# Patient Record
Sex: Female | Born: 1935 | Race: White | Hispanic: No | Marital: Married | Smoking: Never smoker
Health system: Southern US, Community
[De-identification: ages and names within clinical notes are randomized; demographics above are authoritative.]

## PROBLEM LIST (undated history)

## (undated) DIAGNOSIS — F0390 Unspecified dementia without behavioral disturbance: Secondary | ICD-10-CM

## (undated) DIAGNOSIS — K219 Gastro-esophageal reflux disease without esophagitis: Secondary | ICD-10-CM

## (undated) HISTORY — PX: ABDOMINAL HYSTERECTOMY: SHX81

---

## 2011-05-18 ENCOUNTER — Emergency Department (HOSPITAL_COMMUNITY)
Admission: EM | Admit: 2011-05-18 | Discharge: 2011-05-18 | Disposition: A | Discharge status: Home / Self Care | Payer: Medicare Other | Attending: Emergency Medicine | Admitting: Emergency Medicine

## 2011-05-18 ENCOUNTER — Emergency Department (HOSPITAL_COMMUNITY): Payer: Medicare Other

## 2011-05-18 ENCOUNTER — Encounter (HOSPITAL_COMMUNITY): Payer: Self-pay | Admitting: *Deleted

## 2011-05-18 DIAGNOSIS — W1809XA Striking against other object with subsequent fall, initial encounter: Secondary | ICD-10-CM | POA: Insufficient documentation

## 2011-05-18 DIAGNOSIS — W19XXXA Unspecified fall, initial encounter: Secondary | ICD-10-CM

## 2011-05-18 DIAGNOSIS — S0990XA Unspecified injury of head, initial encounter: Secondary | ICD-10-CM | POA: Insufficient documentation

## 2011-05-18 DIAGNOSIS — E119 Type 2 diabetes mellitus without complications: Secondary | ICD-10-CM | POA: Insufficient documentation

## 2011-05-18 DIAGNOSIS — F0390 Unspecified dementia without behavioral disturbance: Secondary | ICD-10-CM | POA: Insufficient documentation

## 2011-05-18 DIAGNOSIS — K219 Gastro-esophageal reflux disease without esophagitis: Secondary | ICD-10-CM | POA: Insufficient documentation

## 2011-05-18 DIAGNOSIS — R51 Headache: Secondary | ICD-10-CM | POA: Insufficient documentation

## 2011-05-18 DIAGNOSIS — Y9210 Unspecified residential institution as the place of occurrence of the external cause: Secondary | ICD-10-CM | POA: Insufficient documentation

## 2011-05-18 HISTORY — DX: Unspecified dementia, unspecified severity, without behavioral disturbance, psychotic disturbance, mood disturbance, and anxiety: F03.90

## 2011-05-18 HISTORY — DX: Gastro-esophageal reflux disease without esophagitis: K21.9

## 2011-05-18 MED ORDER — SODIUM CHLORIDE 0.9 % IV SOLN
INTRAVENOUS | Status: DC
  Administered 2011-05-18: 11:00:00 via INTRAVENOUS

## 2011-05-18 MED ORDER — ONDANSETRON HCL 4 MG/2ML IJ SOLN
4.0000 mg | Freq: Once | INTRAMUSCULAR | Status: AC
  Administered 2011-05-18: 4 mg via INTRAVENOUS
  Filled 2011-05-18: qty 2

## 2011-05-18 NOTE — ED Notes (Signed)
Pt is unable to tell me her phone number or any information of how to get a hold of family. Pt thinks she is Felicia Payne.

## 2011-05-18 NOTE — ED Notes (Signed)
I was able to get a hold of staff at [facility] regarding patient. They have confirmed that Felicia Payne lives in [facility] with husband. 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 Pt resides at [address] Write [address] Boneta Lucks [facility].

## 2011-05-18 NOTE — ED Notes (Signed)
Pt helped back into clothes. [facility] called for transport.

## 2011-05-18 NOTE — ED Notes (Addendum)
Pt up to use restroom. Reports dizziness with ambulation.

## 2011-05-18 NOTE — ED Notes (Signed)
Paperwork given to [facility] 

## 2011-05-18 NOTE — ED Provider Notes (Signed)
History     CSN: [id number]  Arrival date & time 05/18/11  1008   First MD Initiated Contact with Patient 05/18/11 1020      Chief Complaint  Patient presents with  . Fall    (Consider location/radiation/quality/duration/timing/severity/associated sxs/prior treatment) Patient is a 78 y.o. female presenting with fall. The history is provided by the patient.  Fall The accident occurred 1 to 2 hours ago. Associated symptoms include headaches. Pertinent negatives include no numbness, no abdominal pain, no nausea and no vomiting.   [facility] and reportedly [facility] and fell and hit the back of her head on the bedside table. No loss of consciousness. States she has pain in the back of her head. No dizziness. No chest pain or abdominal pain. She states she's been doing well the last few days. No nausea vomiting diarrhea. No neck pain.  Past Medical History  Diagnosis Date  . Dementia   . GERD (gastroesophageal reflux disease)   . Diabetes mellitus     History reviewed. No pertinent past surgical history.  History reviewed. No pertinent family history.  History  Substance Use Topics  . Smoking status: Never Smoker   . Smokeless tobacco: Not on file  . Alcohol Use: No    OB History    Grav Para Term Preterm Abortions TAB SAB Ect Mult Living                  Review of Systems  Constitutional: Negative for activity change and appetite change.  HENT: Negative for neck stiffness.   Eyes: Negative for pain.  Respiratory: Negative for chest tightness and shortness of breath.   Cardiovascular: Negative for chest pain and leg swelling.  Gastrointestinal: Negative for nausea, vomiting, abdominal pain and diarrhea.  Genitourinary: Negative for flank pain.  Musculoskeletal: Negative for back pain.  Skin: Negative for rash.  Neurological: Positive for headaches. Negative for weakness and numbness.  Psychiatric/Behavioral: Negative for behavioral problems.     Allergies  Review of patient's allergies indicates not on file.  Home Medications  No current outpatient prescriptions on file.  BP 133/69  Pulse 97  Temp(Src) 97.9 F (36.6 C) (Oral)  Resp 14  SpO2 97%  Physical Exam  Nursing note and vitals reviewed. Constitutional: She is oriented to person, place, and time. She appears well-developed and well-nourished.  HENT:  Head: Normocephalic.       Apparent hematoma and tenderness to left occipital area.  Eyes: EOM are normal. Pupils are equal, round, and reactive to light.  Neck: Normal range of motion. Neck supple. No thyromegaly present.  Cardiovascular: Normal rate, regular rhythm and normal heart sounds.   No murmur heard. Pulmonary/Chest: Effort normal and breath sounds normal. No respiratory distress. She has no wheezes. She has no rales.  Abdominal: Soft. Bowel sounds are normal. She exhibits no distension. There is no tenderness. There is no rebound and no guarding.  Musculoskeletal: Normal range of motion.       Cervical range of motion intact and painless.  Lymphadenopathy:    She has no cervical adenopathy.  Neurological: She is alert and oriented to person, place, and time. No cranial nerve deficit.  Skin: Skin is warm and dry.  Psychiatric: She has a normal mood and affect. Her speech is normal.    ED Course  Procedures (including critical care time)  Labs Reviewed - No data to display Ct Head Wo Contrast  05/18/2011  *RADIOLOGY REPORT*  Clinical Data: Headache.  Status post fall.  CT HEAD WITHOUT CONTRAST  Technique:  Contiguous axial images were obtained from the base of the skull through the vertex without contrast.  Comparison: None  Findings:  There is diffuse patchy low density throughout the subcortical and periventricular white matter consistent with chronic small vessel ischemic change.  There is prominence of the sulci and ventricles consistent with brain atrophy.  There is no evidence for acute brain  infarct, hemorrhage or mass.  The paranasal sinuses are clear.  The mastoid air cells are clear. The skull is intact.  IMPRESSION:  1.  Small vessel ischemic disease and brain atrophy. 2.  No acute intracranial abnormalities.  Original Report Authenticated By: Rosealee Albee, M.D.     1. Fall   2. Minor head injury     Patient developed some nauseousness and dizziness with sitting up. It resolved with rest.  MDM  Patient fell and hit her head. Report mechanical fall. Head CT is negative. Patient was able ambulate and was at her baseline. She was discharged home        Juliet Rude. Rubin Payor, MD 05/18/11 1546

## 2011-05-18 NOTE — ED Notes (Signed)
Pt is from senior living states she lost balance and fell striking the back of her head on the bedside table. Pt with hematoma. Denies any loc. Pt on LSB and [facility].

## 2012-06-04 ENCOUNTER — Other Ambulatory Visit: Payer: Self-pay | Admitting: Geriatric Medicine

## 2012-06-04 ENCOUNTER — Ambulatory Visit
Admission: RE | Admit: 2012-06-04 | Discharge: 2012-06-04 | Disposition: A | Discharge status: Home / Self Care | Payer: No Typology Code available for payment source | Source: Ambulatory Visit | Attending: Geriatric Medicine | Admitting: Geriatric Medicine

## 2012-06-04 DIAGNOSIS — Z9181 History of falling: Secondary | ICD-10-CM

## 2012-06-04 DIAGNOSIS — R110 Nausea: Secondary | ICD-10-CM

## 2012-08-29 ENCOUNTER — Other Ambulatory Visit: Payer: Self-pay | Admitting: Geriatric Medicine

## 2012-08-29 ENCOUNTER — Ambulatory Visit
Admission: RE | Admit: 2012-08-29 | Discharge: 2012-08-29 | Disposition: A | Discharge status: Home / Self Care | Payer: Medicare Other | Source: Ambulatory Visit | Attending: Geriatric Medicine | Admitting: Geriatric Medicine

## 2012-08-29 ENCOUNTER — Other Ambulatory Visit: Payer: Medicare Other

## 2012-08-29 DIAGNOSIS — R52 Pain, unspecified: Secondary | ICD-10-CM

## 2012-08-29 DIAGNOSIS — W19XXXA Unspecified fall, initial encounter: Secondary | ICD-10-CM

## 2012-08-29 DIAGNOSIS — W19XXXD Unspecified fall, subsequent encounter: Secondary | ICD-10-CM

## 2012-09-05 ENCOUNTER — Other Ambulatory Visit: Payer: Self-pay | Admitting: Geriatric Medicine

## 2012-09-05 ENCOUNTER — Ambulatory Visit
Admission: RE | Admit: 2012-09-05 | Discharge: 2012-09-05 | Disposition: A | Discharge status: Home / Self Care | Payer: Medicare Other | Source: Ambulatory Visit | Attending: Geriatric Medicine | Admitting: Geriatric Medicine

## 2012-09-05 DIAGNOSIS — M545 Low back pain: Secondary | ICD-10-CM

## 2013-02-11 ENCOUNTER — Ambulatory Visit (INDEPENDENT_AMBULATORY_CARE_PROVIDER_SITE_OTHER): Payer: Medicare Other | Admitting: Podiatry

## 2013-02-11 ENCOUNTER — Encounter: Payer: Self-pay | Admitting: Podiatry

## 2013-02-11 VITALS — BP 125/69 | HR 105 | Resp 16 | Ht 61.0 in | Wt 160.0 lb

## 2013-02-11 DIAGNOSIS — Q828 Other specified congenital malformations of skin: Secondary | ICD-10-CM

## 2013-02-11 DIAGNOSIS — E119 Type 2 diabetes mellitus without complications: Secondary | ICD-10-CM

## 2013-02-11 NOTE — Progress Notes (Signed)
Felicia Payne presents today with a chief complaint of a painful callus to her left hallux and sub-first metatarsophalangeal joint left.  Objective: She is alert and oriented. Pulses remain palpable left foot. Reactive hyperkeratosis present hallux and sub-first met left. No other lesions.  Assessment: Plantarflexed first metatarsal resulting in a reactive hyperkeratosis sub-first met left. Reactive hyperkeratosis plantar medial aspect of the IP joint hallux left.  Plan: Debridement of all reactive hyperkeratosis today without iatrogenic lesions. All with her on an as-needed basis or for routine nail debridement.

## 2013-02-26 ENCOUNTER — Telehealth: Payer: Self-pay | Admitting: *Deleted

## 2013-02-26 NOTE — Telephone Encounter (Signed)
Pattricia Boss states unable to read Dr Geryl Rankins statement after debrided callouse.  I checked to progress notes and it stated pulses are still palpable in left foot.

## 2013-05-06 ENCOUNTER — Ambulatory Visit: Payer: Self-pay | Admitting: Podiatry

## 2013-05-15 ENCOUNTER — Ambulatory Visit: Payer: Self-pay | Admitting: Podiatry

## 2013-05-20 ENCOUNTER — Ambulatory Visit (INDEPENDENT_AMBULATORY_CARE_PROVIDER_SITE_OTHER): Payer: Medicare Other

## 2013-05-20 ENCOUNTER — Encounter: Payer: Self-pay | Admitting: Podiatry

## 2013-05-20 ENCOUNTER — Ambulatory Visit (INDEPENDENT_AMBULATORY_CARE_PROVIDER_SITE_OTHER): Payer: Medicare Other | Admitting: Podiatry

## 2013-05-20 VITALS — BP 122/72 | HR 90 | Resp 16 | Ht 61.0 in

## 2013-05-20 DIAGNOSIS — M19071 Primary osteoarthritis, right ankle and foot: Secondary | ICD-10-CM

## 2013-05-20 DIAGNOSIS — M79609 Pain in unspecified limb: Secondary | ICD-10-CM

## 2013-05-20 DIAGNOSIS — B351 Tinea unguium: Secondary | ICD-10-CM

## 2013-05-20 DIAGNOSIS — M79673 Pain in unspecified foot: Secondary | ICD-10-CM

## 2013-05-20 DIAGNOSIS — M19079 Primary osteoarthritis, unspecified ankle and foot: Secondary | ICD-10-CM

## 2013-05-20 NOTE — Progress Notes (Signed)
She presents today with a chief complaint of painful toenails one through 5 bilateral.  Objective: Vital signs are stable she is alert and oriented x3. Pulses are palpable bilateral. Nails are thick yellow dystrophic with mycotic.

## 2013-12-02 ENCOUNTER — Encounter: Payer: Self-pay | Admitting: Podiatry

## 2013-12-02 ENCOUNTER — Ambulatory Visit (INDEPENDENT_AMBULATORY_CARE_PROVIDER_SITE_OTHER): Payer: Medicare Other | Admitting: Podiatry

## 2013-12-02 DIAGNOSIS — M79676 Pain in unspecified toe(s): Secondary | ICD-10-CM

## 2013-12-02 DIAGNOSIS — L84 Corns and callosities: Secondary | ICD-10-CM

## 2013-12-02 DIAGNOSIS — B351 Tinea unguium: Secondary | ICD-10-CM

## 2013-12-02 DIAGNOSIS — E119 Type 2 diabetes mellitus without complications: Secondary | ICD-10-CM

## 2013-12-02 DIAGNOSIS — M79609 Pain in unspecified limb: Secondary | ICD-10-CM

## 2013-12-02 DIAGNOSIS — Q828 Other specified congenital malformations of skin: Secondary | ICD-10-CM

## 2013-12-02 NOTE — Progress Notes (Signed)
She presents today with a chief complaint of painful elongated toenails painful calluses plantar aspect of the bilateral foot.  Objective: Vital signs are stable she is alert and oriented x3 pulses are palpable bilateral. Nails are thick yellow dystrophic onychomycotic and painful palpation. Reactive hyperkeratosis and porokeratotic lesions plantar aspect of the bilateral foot are painful and noted to be noninfected.  Assessment: Pain in limb secondary to onychomycosis and porokeratosis bilateral.  Plan: Debridement all reactive hyperkeratosis and debridement nails 1 through 5 bilateral covered service secondary to pain. 

## 2014-03-10 ENCOUNTER — Encounter: Payer: Self-pay | Admitting: Podiatry

## 2014-03-10 ENCOUNTER — Ambulatory Visit (INDEPENDENT_AMBULATORY_CARE_PROVIDER_SITE_OTHER): Payer: Medicare Other | Admitting: Podiatry

## 2014-03-10 DIAGNOSIS — M79676 Pain in unspecified toe(s): Secondary | ICD-10-CM

## 2014-03-10 DIAGNOSIS — Q828 Other specified congenital malformations of skin: Secondary | ICD-10-CM

## 2014-03-10 DIAGNOSIS — B351 Tinea unguium: Secondary | ICD-10-CM

## 2014-03-10 DIAGNOSIS — E1151 Type 2 diabetes mellitus with diabetic peripheral angiopathy without gangrene: Secondary | ICD-10-CM

## 2014-03-10 NOTE — Progress Notes (Signed)
She presents today with a chief complaint of painful elongated toenails painful calluses plantar aspect of the bilateral foot.  Objective: Vital signs are stable she is alert and oriented x3 pulses are palpable bilateral. Nails are thick yellow dystrophic onychomycotic and painful palpation. Reactive hyperkeratosis and porokeratotic lesions plantar aspect of the bilateral foot are painful and noted to be noninfected.  Assessment: Pain in limb secondary to onychomycosis and porokeratosis bilateral.  Plan: Debridement all reactive hyperkeratosis and debridement nails 1 through 5 bilateral covered service secondary to pain.

## 2014-03-10 NOTE — Patient Instructions (Signed)
Diabetes and Foot Care Diabetes may cause you to have problems because of poor blood supply (circulation) to your feet and legs. This may cause the skin on your feet to become thinner, break easier, and heal more slowly. Your skin may become dry, and the skin may peel and crack. You may also have nerve damage in your legs and feet causing decreased feeling in them. You may not notice minor injuries to your feet that could lead to infections or more serious problems. Taking care of your feet is one of the most important things you can do for yourself.  HOME CARE INSTRUCTIONS  Wear shoes at all times, even in the house. Do not go barefoot. Bare feet are easily injured.  Check your feet daily for blisters, cuts, and redness. If you cannot see the bottom of your feet, use a mirror or ask someone for help.  Wash your feet with warm water (do not use hot water) and mild soap. Then pat your feet and the areas between your toes until they are completely dry. Do not soak your feet as this can dry your skin.  Apply a moisturizing lotion or petroleum jelly (that does not contain alcohol and is unscented) to the skin on your feet and to dry, brittle toenails. Do not apply lotion between your toes.  Trim your toenails straight across. Do not dig under them or around the cuticle. File the edges of your nails with an emery board or nail file.  Do not cut corns or calluses or try to remove them with medicine.  Wear clean socks or stockings every day. Make sure they are not too tight. Do not wear knee-high stockings since they may decrease blood flow to your legs.  Wear shoes that fit properly and have enough cushioning. To break in new shoes, wear them for just a few hours a day. This prevents you from injuring your feet. Always look in your shoes before you put them on to be sure there are no objects inside.  Do not cross your legs. This may decrease the blood flow to your feet.  If you find a minor scrape,  cut, or break in the skin on your feet, keep it and the skin around it clean and dry. These areas may be cleansed with mild soap and water. Do not cleanse the area with peroxide, alcohol, or iodine.  When you remove an adhesive bandage, be sure not to damage the skin around it.  If you have a wound, look at it several times a day to make sure it is healing.  Do not use heating pads or hot water bottles. They may burn your skin. If you have lost feeling in your feet or legs, you may not know it is happening until it is too late.  Make sure your health care provider performs a complete foot exam at least annually or more often if you have foot problems. Report any cuts, sores, or bruises to your health care provider immediately. SEEK MEDICAL CARE IF:   You have an injury that is not healing.  You have cuts or breaks in the skin.  You have an ingrown nail.  You notice redness on your legs or feet.  You feel burning or tingling in your legs or feet.  You have pain or cramps in your legs and feet.  Your legs or feet are numb.  Your feet always feel cold. SEEK IMMEDIATE MEDICAL CARE IF:   There is increasing redness,   swelling, or pain in or around a wound.  There is a red line that goes up your leg.  Pus is coming from a wound.  You develop a fever or as directed by your health care provider.  You notice a bad smell coming from an ulcer or wound. Document Released: 04/07/2000 Document Revised: 12/11/2012 Document Reviewed: 09/17/2012 Exit[employer] Patient Information 2015 [employer]. This information is not intended to replace advice given to you by your health care provider. Make sure you discuss any questions you have with your health care provider.  

## 2014-06-11 ENCOUNTER — Ambulatory Visit (INDEPENDENT_AMBULATORY_CARE_PROVIDER_SITE_OTHER): Payer: Medicare Other | Admitting: Podiatry

## 2014-06-11 DIAGNOSIS — B351 Tinea unguium: Secondary | ICD-10-CM

## 2014-06-11 DIAGNOSIS — Q828 Other specified congenital malformations of skin: Secondary | ICD-10-CM

## 2014-06-11 DIAGNOSIS — M79676 Pain in unspecified toe(s): Secondary | ICD-10-CM

## 2014-06-11 NOTE — Progress Notes (Signed)
She presents today with a chief complaint of painful elongated toenails painful calluses plantar aspect of the bilateral foot.  Objective: Vital signs are stable she is alert and oriented x3 pulses are palpable bilateral. Nails are thick yellow dystrophic onychomycotic and painful palpation. Reactive hyperkeratosis and porokeratotic lesions plantar aspect of the bilateral foot are painful and noted to be noninfected.  Assessment: Pain in limb secondary to onychomycosis and porokeratosis bilateral.  Plan: Debridement all reactive hyperkeratosis and debridement nails 1 through 5 bilateral covered service secondary to pain. 

## 2014-09-17 ENCOUNTER — Ambulatory Visit: Payer: Medicare Other

## 2014-10-13 ENCOUNTER — Ambulatory Visit (INDEPENDENT_AMBULATORY_CARE_PROVIDER_SITE_OTHER): Payer: Medicare Other | Admitting: Podiatry

## 2014-10-13 ENCOUNTER — Encounter: Payer: Self-pay | Admitting: Podiatry

## 2014-10-13 DIAGNOSIS — B351 Tinea unguium: Secondary | ICD-10-CM

## 2014-10-13 DIAGNOSIS — M79676 Pain in unspecified toe(s): Secondary | ICD-10-CM | POA: Diagnosis not present

## 2014-10-13 DIAGNOSIS — E119 Type 2 diabetes mellitus without complications: Secondary | ICD-10-CM

## 2014-10-13 NOTE — Progress Notes (Signed)
Patient ID: Felicia Payne, female   DOB: [birth year], 81 y.o.   MRN: [id number] Complaint:  Visit Type: Patient returns to my office for continued preventative foot care services. Complaint: Patient states" my nails have grown long and thick and become painful to walk and wear shoes" Patient has been diagnosed with DM with no foot complications.She presents for preventative foot care services. No changes to ROS  Podiatric Exam: Vascular: dorsalis pedis and posterior tibial pulses are palpable bilateral. Capillary return is immediate. Temperature gradient is WNL. Skin turgor WNL  Sensorium: Normal Semmes Weinstein monofilament test. Normal tactile sensation bilaterally. Nail Exam: Pt has thick disfigured discolored nails with subungual debris noted bilateral entire nail hallux through fifth toenails Ulcer Exam: There is no evidence of ulcer or pre-ulcerative changes or infection. Orthopedic Exam: Muscle tone and strength are WNL. No limitations in general ROM. No crepitus or effusions noted. Foot type and digits show no abnormalities. Bony prominences are unremarkable. Skin: No Porokeratosis. No infection or ulcers.  Diffuse asymptomatic callus   Diagnosis:  Tinea unguium, Pain in right toe, pain in left toes  Treatment & Plan Procedures and Treatment: Consent by patient was obtained for treatment procedures. The patient understood the discussion of treatment and procedures well. All questions were answered thoroughly reviewed. Debridement of mycotic and hypertrophic toenails, 1 through 5 bilateral and clearing of subungual debris. No ulceration, no infection noted.  Return Visit-Office Procedure: Patient instructed to return to the office for a follow up visit 3 months for continued evaluation and treatment.

## 2015-01-19 ENCOUNTER — Encounter: Payer: Self-pay | Admitting: Podiatry

## 2015-01-19 ENCOUNTER — Ambulatory Visit (INDEPENDENT_AMBULATORY_CARE_PROVIDER_SITE_OTHER): Payer: Medicare Other | Admitting: Podiatry

## 2015-01-19 DIAGNOSIS — M79676 Pain in unspecified toe(s): Secondary | ICD-10-CM | POA: Diagnosis not present

## 2015-01-19 DIAGNOSIS — B351 Tinea unguium: Secondary | ICD-10-CM

## 2015-01-19 DIAGNOSIS — E119 Type 2 diabetes mellitus without complications: Secondary | ICD-10-CM | POA: Diagnosis not present

## 2015-01-19 NOTE — Progress Notes (Signed)
Patient ID: Felicia Payne, female   DOB: 08/28/1930, 81 y.o.   MRN: [id number] Complaint:  Visit Type: Patient returns to my office for continued preventative foot care services. Complaint: Patient states" my nails have grown long and thick and become painful to walk and wear shoes" Patient has been diagnosed with DM with no foot complications.She presents for preventative foot care services. No changes to ROS  Podiatric Exam: Vascular: dorsalis pedis and posterior tibial pulses are palpable bilateral. Capillary return is immediate. Temperature gradient is WNL. Skin turgor WNL  Sensorium: Normal Semmes Weinstein monofilament test. Normal tactile sensation bilaterally. Nail Exam: Pt has thick disfigured discolored nails with subungual debris noted bilateral entire nail hallux through fifth toenails Ulcer Exam: There is no evidence of ulcer or pre-ulcerative changes or infection. Orthopedic Exam: Muscle tone and strength are WNL. No limitations in general ROM. No crepitus or effusions noted. Foot type and digits show no abnormalities. Bony prominences are unremarkable. Skin: No Porokeratosis. No infection or ulcers.  Diffuse asymptomatic callus   Diagnosis:  Tinea unguium, Pain in right toe, pain in left toes  Treatment & Plan Procedures and Treatment: Consent by patient was obtained for treatment procedures. The patient understood the discussion of treatment and procedures well. All questions were answered thoroughly reviewed. Debridement of mycotic and hypertrophic toenails, 1 through 5 bilateral and clearing of subungual debris. No ulceration, no infection noted.  Return Visit-Office Procedure: Patient instructed to return to the office for a follow up visit 3 months for continued evaluation and treatment. 

## 2015-04-27 ENCOUNTER — Encounter: Payer: Self-pay | Admitting: Podiatry

## 2015-04-27 ENCOUNTER — Ambulatory Visit (INDEPENDENT_AMBULATORY_CARE_PROVIDER_SITE_OTHER): Payer: Medicare Other | Admitting: Podiatry

## 2015-04-27 DIAGNOSIS — B351 Tinea unguium: Secondary | ICD-10-CM | POA: Diagnosis not present

## 2015-04-27 DIAGNOSIS — M79676 Pain in unspecified toe(s): Secondary | ICD-10-CM | POA: Diagnosis not present

## 2015-04-27 NOTE — Progress Notes (Signed)
Patient ID: Felicia Payne, female   DOB: 02/04/1931, 82 y.o.   MRN: [id number] Complaint:  Visit Type: Patient returns to my office for continued preventative foot care services. Complaint: Patient states" my nails have grown long and thick and become painful to walk and wear shoes" Patient has been diagnosed with DM with no foot complications.She presents for preventative foot care services. No changes to ROS  Podiatric Exam: Vascular: dorsalis pedis and posterior tibial pulses are palpable bilateral. Capillary return is immediate. Temperature gradient is WNL. Skin turgor WNL  Sensorium: Normal Semmes Weinstein monofilament test. Normal tactile sensation bilaterally. Nail Exam: Pt has thick disfigured discolored nails with subungual debris noted bilateral entire nail hallux through fifth toenails Ulcer Exam: There is no evidence of ulcer or pre-ulcerative changes or infection. Orthopedic Exam: Muscle tone and strength are WNL. No limitations in general ROM. No crepitus or effusions noted. Foot type and digits show no abnormalities. Bony prominences are unremarkable. Skin: No Porokeratosis. No infection or ulcers.  Diffuse asymptomatic callus   Diagnosis:  Tinea unguium, Pain in right toe, pain in left toes  Treatment & Plan Procedures and Treatment: Consent by patient was obtained for treatment procedures. The patient understood the discussion of treatment and procedures well. All questions were answered thoroughly reviewed. Debridement of mycotic and hypertrophic toenails, 1 through 5 bilateral and clearing of subungual debris. No ulceration, no infection noted.  Return Visit-Office Procedure: Patient instructed to return to the office for a follow up visit 3 months for continued evaluation and treatment.   Marquelle Musgrave DPM 

## 2015-04-28 ENCOUNTER — Telehealth: Payer: Self-pay | Admitting: *Deleted

## 2015-04-28 NOTE — Telephone Encounter (Addendum)
Misty StanleyLisa - 2nd shift nurse supervisor states they are unable to read Dr. Aram BeechamMayer's note on the returning paperwork for this pt.  Left message informing pt Dr. Stacie AcresMayer stated the appt was for a debridement of toenails with no abnormal findings, and to return in 3 months or as needed.  Mardelle Mattendy states [facility] did not get their return orders sheet, but still need notes stating pt was seen at [facility] on 04/27/2015.  Faxed clinicals of 04/27/2015.

## 2015-06-04 ENCOUNTER — Emergency Department (HOSPITAL_COMMUNITY): Payer: Medicare Other

## 2015-06-04 ENCOUNTER — Emergency Department (HOSPITAL_COMMUNITY)
Admission: EM | Admit: 2015-06-04 | Discharge: 2015-06-04 | Disposition: A | Discharge status: Home / Self Care | Payer: Medicare Other | Attending: Emergency Medicine | Admitting: Emergency Medicine

## 2015-06-04 ENCOUNTER — Encounter (HOSPITAL_COMMUNITY): Payer: Self-pay | Admitting: Emergency Medicine

## 2015-06-04 DIAGNOSIS — Z79899 Other long term (current) drug therapy: Secondary | ICD-10-CM | POA: Insufficient documentation

## 2015-06-04 DIAGNOSIS — Z7982 Long term (current) use of aspirin: Secondary | ICD-10-CM | POA: Diagnosis not present

## 2015-06-04 DIAGNOSIS — W050XXA Fall from non-moving wheelchair, initial encounter: Secondary | ICD-10-CM | POA: Insufficient documentation

## 2015-06-04 DIAGNOSIS — E119 Type 2 diabetes mellitus without complications: Secondary | ICD-10-CM | POA: Diagnosis not present

## 2015-06-04 DIAGNOSIS — S8992XA Unspecified injury of left lower leg, initial encounter: Secondary | ICD-10-CM | POA: Insufficient documentation

## 2015-06-04 DIAGNOSIS — Z791 Long term (current) use of non-steroidal anti-inflammatories (NSAID): Secondary | ICD-10-CM | POA: Diagnosis not present

## 2015-06-04 DIAGNOSIS — K219 Gastro-esophageal reflux disease without esophagitis: Secondary | ICD-10-CM | POA: Diagnosis not present

## 2015-06-04 DIAGNOSIS — S3992XA Unspecified injury of lower back, initial encounter: Secondary | ICD-10-CM | POA: Insufficient documentation

## 2015-06-04 DIAGNOSIS — Y9389 Activity, other specified: Secondary | ICD-10-CM | POA: Diagnosis not present

## 2015-06-04 DIAGNOSIS — Y9289 Other specified places as the place of occurrence of the external cause: Secondary | ICD-10-CM | POA: Diagnosis not present

## 2015-06-04 DIAGNOSIS — F0390 Unspecified dementia without behavioral disturbance: Secondary | ICD-10-CM | POA: Diagnosis not present

## 2015-06-04 DIAGNOSIS — S3991XA Unspecified injury of abdomen, initial encounter: Secondary | ICD-10-CM | POA: Diagnosis not present

## 2015-06-04 DIAGNOSIS — Y998 Other external cause status: Secondary | ICD-10-CM | POA: Insufficient documentation

## 2015-06-04 DIAGNOSIS — N390 Urinary tract infection, site not specified: Secondary | ICD-10-CM | POA: Diagnosis not present

## 2015-06-04 DIAGNOSIS — G8929 Other chronic pain: Secondary | ICD-10-CM | POA: Diagnosis not present

## 2015-06-04 DIAGNOSIS — M545 Low back pain, unspecified: Secondary | ICD-10-CM

## 2015-06-04 DIAGNOSIS — S8991XA Unspecified injury of right lower leg, initial encounter: Secondary | ICD-10-CM | POA: Diagnosis not present

## 2015-06-04 DIAGNOSIS — Z7984 Long term (current) use of oral hypoglycemic drugs: Secondary | ICD-10-CM | POA: Insufficient documentation

## 2015-06-04 LAB — CBC WITH DIFFERENTIAL/PLATELET
BASOS PCT: 0 %
Basophils Absolute: 0 10*3/uL (ref 0.0–0.1)
EOS ABS: 0.2 10*3/uL (ref 0.0–0.7)
EOS PCT: 3 %
HCT: 39.7 % (ref 36.0–46.0)
Hemoglobin: 12.2 g/dL (ref 12.0–15.0)
LYMPHS ABS: 2.4 10*3/uL (ref 0.7–4.0)
Lymphocytes Relative: 29 %
MCH: 29.8 pg (ref 26.0–34.0)
MCHC: 30.7 g/dL (ref 30.0–36.0)
MCV: 96.8 fL (ref 78.0–100.0)
MONO ABS: 0.4 10*3/uL (ref 0.1–1.0)
MONOS PCT: 5 %
NEUTROS PCT: 63 %
Neutro Abs: 5.3 10*3/uL (ref 1.7–7.7)
PLATELETS: 282 10*3/uL (ref 150–400)
RBC: 4.1 MIL/uL (ref 3.87–5.11)
RDW: 13.7 % (ref 11.5–15.5)
WBC: 8.3 10*3/uL (ref 4.0–10.5)

## 2015-06-04 LAB — COMPREHENSIVE METABOLIC PANEL
ALK PHOS: 70 U/L (ref 38–126)
ALT: 14 U/L (ref 14–54)
AST: 16 U/L (ref 15–41)
Albumin: 3.5 g/dL (ref 3.5–5.0)
Anion gap: 11 (ref 5–15)
BUN: 15 mg/dL (ref 6–20)
CALCIUM: 9.5 mg/dL (ref 8.9–10.3)
CO2: 28 mmol/L (ref 22–32)
CREATININE: 0.74 mg/dL (ref 0.44–1.00)
Chloride: 103 mmol/L (ref 101–111)
GFR calc non Af Amer: >60 mL/min (ref >60)
GLUCOSE: 123 mg/dL — AB (ref 65–99)
Potassium: 4.5 mmol/L (ref 3.5–5.1)
SODIUM: 142 mmol/L (ref 135–145)
Total Bilirubin: 0.4 mg/dL (ref 0.3–1.2)
Total Protein: 7 g/dL (ref 6.5–8.1)

## 2015-06-04 LAB — URINE MICROSCOPIC-ADD ON

## 2015-06-04 LAB — URINALYSIS, ROUTINE W REFLEX MICROSCOPIC
Bilirubin Urine: NEGATIVE
Glucose, UA: NEGATIVE mg/dL
Ketones, ur: NEGATIVE mg/dL
Nitrite: POSITIVE — AB
Protein, ur: NEGATIVE mg/dL
SPECIFIC GRAVITY, URINE: 1.021 (ref 1.005–1.030)
pH: 5 (ref 5.0–8.0)

## 2015-06-04 MED ORDER — CEPHALEXIN 500 MG PO CAPS: 500.0000 mg | ORAL_CAPSULE | Freq: Two times a day (BID) | ORAL | Status: DC

## 2015-06-04 NOTE — ED Notes (Signed)
Patient attempted to urinate, was unsuccessful at this time. RN notified, will attempt again

## 2015-06-04 NOTE — Discharge Instructions (Signed)
You have a urinary tract infection and cultures were sent.  Your back xrays were very limited. If you have continued pain in your back you will need to follow up for more imaging.     Back Pain, Adult Back pain is very common in adults.The cause of back pain is rarely dangerous and the pain often gets better over time.The cause of your back pain may not be known. Some common causes of back pain include:  Strain of the muscles or ligaments supporting the spine.  Wear and tear (degeneration) of the spinal disks.  Arthritis.  Direct injury to the back. For many people, back pain may return. Since back pain is rarely dangerous, most people can learn to manage this condition on their own. HOME CARE INSTRUCTIONS Watch your back pain for any changes. The following actions may help to lessen any discomfort you are feeling:  Remain active. It is stressful on your back to sit or stand in one place for long periods of time. Do not sit, drive, or stand in one place for more than 30 minutes at a time. Take short walks on even surfaces as soon as you are able.Try to increase the length of time you walk each day.  Exercise regularly as directed by your health care provider. Exercise helps your back heal faster. It also helps avoid future injury by keeping your muscles strong and flexible.  Do not stay in bed.Resting more than 1-2 days can delay your recovery.  Pay attention to your body when you bend and lift. The most comfortable positions are those that put less stress on your recovering back. Always use proper lifting techniques, including:  Bending your knees.  Keeping the load close to your body.  Avoiding twisting.  Find a comfortable position to sleep. Use a firm mattress and lie on your side with your knees slightly bent. If you lie on your back, put a pillow under your knees.  Avoid feeling anxious or stressed.Stress increases muscle tension and can worsen back pain.It is important  to recognize when you are anxious or stressed and learn ways to manage it, such as with exercise.  Take medicines only as directed by your health care provider. Over-the-counter medicines to reduce pain and inflammation are often the most helpful.Your health care provider may prescribe muscle relaxant drugs.These medicines help dull your pain so you can more quickly return to your normal activities and healthy exercise.  Apply ice to the injured area:  Put ice in a plastic bag.  Place a towel between your skin and the bag.  Leave the ice on for 20 minutes, 2-3 times a day for the first 2-3 days. After that, ice and heat may be alternated to reduce pain and spasms.  Maintain a healthy weight. Excess weight puts extra stress on your back and makes it difficult to maintain good posture. SEEK MEDICAL CARE IF:  You have pain that is not relieved with rest or medicine.  You have increasing pain going down into the legs or buttocks.  You have pain that does not improve in one week.  You have night pain.  You lose weight.  You have a fever or chills. SEEK IMMEDIATE MEDICAL CARE IF:   You develop new bowel or bladder control problems.  You have unusual weakness or numbness in your arms or legs.  You develop nausea or vomiting.  You develop abdominal pain.  You feel faint.   This information is not intended to replace advice  given to you by your health care provider. Make sure you discuss any questions you have with your health care provider.   Document Released: 04/10/2005 Document Revised: 05/01/2014 Document Reviewed: 08/12/2013 Elsevier Interactive Patient Education 2016 [employer]  Urinary Tract Infection Urinary tract infections (UTIs) can develop anywhere along your urinary tract. Your urinary tract is your body's drainage system for removing wastes and extra water. Your urinary tract includes two kidneys, two ureters, a bladder, and a urethra. Your kidneys are a pair  of bean-shaped organs. Each kidney is about the size of your fist. They are located below your ribs, one on each side of your spine. CAUSES Infections are caused by microbes, which are microscopic organisms, including fungi, viruses, and bacteria. These organisms are so small that they can only be seen through a microscope. Bacteria are the microbes that most commonly cause UTIs. SYMPTOMS  Symptoms of UTIs may vary by age and gender of the patient and by the location of the infection. Symptoms in young women typically include a frequent and intense urge to urinate and a painful, burning feeling in the bladder or urethra during urination. Older women and men are more likely to be tired, shaky, and weak and have muscle aches and abdominal pain. A fever may mean the infection is in your kidneys. Other symptoms of a kidney infection include pain in your back or sides below the ribs, nausea, and vomiting. DIAGNOSIS To diagnose a UTI, your caregiver will ask you about your symptoms. Your caregiver will also ask you to provide a urine sample. The urine sample will be tested for bacteria and white blood cells. White blood cells are made by your body to help fight infection. TREATMENT  Typically, UTIs can be treated with medication. Because most UTIs are caused by a bacterial infection, they usually can be treated with the use of antibiotics. The choice of antibiotic and length of treatment depend on your symptoms and the type of bacteria causing your infection. HOME CARE INSTRUCTIONS  If you were prescribed antibiotics, take them exactly as your caregiver instructs you. Finish the medication even if you feel better after you have only taken some of the medication.  Drink enough water and fluids to keep your urine clear or pale yellow.  Avoid caffeine, tea, and carbonated beverages. They tend to irritate your bladder.  Empty your bladder often. Avoid holding urine for long periods of time.  Empty your  bladder before and after sexual intercourse.  After a bowel movement, women should cleanse from front to back. Use each tissue only once. SEEK MEDICAL CARE IF:   You have back pain.  You develop a fever.  Your symptoms do not begin to resolve within 3 days. SEEK IMMEDIATE MEDICAL CARE IF:   You have severe back pain or lower abdominal pain.  You develop chills.  You have nausea or vomiting.  You have continued burning or discomfort with urination. MAKE SURE YOU:   Understand these instructions.  Will watch your condition.  Will get help right away if you are not doing well or get worse.   This information is not intended to replace advice given to you by your health care provider. Make sure you discuss any questions you have with your health care provider.   Document Released: 01/18/2005 Document Revised: 12/30/2014 Document Reviewed: 05/19/2011 Elsevier Interactive Patient Education [employer]

## 2015-06-04 NOTE — ED Notes (Signed)
Bed: WA09 Expected date:  Expected time:  Means of arrival:  Comments: EMS FALL 
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
## 2015-06-04 NOTE — ED Notes (Signed)
Rees at bedside. 

## 2015-06-04 NOTE — ED Notes (Signed)
Bed: [facility]ALB Expected date:  Expected time:  Means of arrival:  Comments: 
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
## 2015-06-06 LAB — URINE CULTURE: Culture: >=100000

## 2015-06-07 ENCOUNTER — Telehealth (HOSPITAL_COMMUNITY): Payer: Self-pay

## 2015-06-07 NOTE — Telephone Encounter (Signed)
Post ED Visit - Positive Culture Follow-up  Culture report reviewed by antimicrobial stewardship pharmacist:   Enzo Bi, Pharm.D.  Celedonio Miyamoto, Pharm.D., BCPS  Garvin Fila, Pharm.D.  Georgina Pillion, Pharm.D., BCPS  [place], [place].D., BCPS, AAHIVP  Estella Husk, Pharm.D., BCPS, AAHIVP  Tennis Must, Pharm.D.  Sherle Poe, [address].D.  Positive urine culture, >/= 100,000 colonies -> Klebsiella Pneumoniae Treated with Cephalexin, organism sensitive to the same and no further patient follow-up is required at this time.  Arvid Right 06/07/2015, 8:39 PM

## 2015-07-27 ENCOUNTER — Ambulatory Visit: Payer: Medicare Other | Admitting: Podiatry

## 2015-08-31 ENCOUNTER — Encounter: Payer: Self-pay | Admitting: Podiatry

## 2015-08-31 ENCOUNTER — Ambulatory Visit (INDEPENDENT_AMBULATORY_CARE_PROVIDER_SITE_OTHER): Payer: Medicare Other | Admitting: Podiatry

## 2015-08-31 DIAGNOSIS — M79676 Pain in unspecified toe(s): Secondary | ICD-10-CM | POA: Diagnosis not present

## 2015-08-31 DIAGNOSIS — B351 Tinea unguium: Secondary | ICD-10-CM

## 2015-08-31 DIAGNOSIS — E119 Type 2 diabetes mellitus without complications: Secondary | ICD-10-CM

## 2015-08-31 NOTE — Addendum Note (Signed)
Addended by: Harlon FlorSOUTHERLAND, Kathlen Sakurai L on: 08/31/2015 04:32 PM   Modules accepted: Orders, Medications

## 2015-08-31 NOTE — Progress Notes (Signed)
Patient ID: Felicia Payne, female   DOB: 06/03/1930, 82 y.o.   MRN: [id number] Complaint:  Visit Type: Patient returns to my office for continued preventative foot care services. Complaint: Patient states" my nails have grown long and thick and become painful to walk and wear shoes" Patient has been diagnosed with DM with no foot complications.She presents for preventative foot care services. No changes to ROS  Podiatric Exam: Vascular: dorsalis pedis and posterior tibial pulses are palpable bilateral. Capillary return is immediate. Temperature gradient is WNL. Skin turgor WNL  Sensorium: Normal Semmes Weinstein monofilament test. Normal tactile sensation bilaterally. Nail Exam: Pt has thick disfigured discolored nails with subungual debris noted bilateral entire nail hallux through fifth toenails Ulcer Exam: There is no evidence of ulcer or pre-ulcerative changes or infection. Orthopedic Exam: Muscle tone and strength are WNL. No limitations in general ROM. No crepitus or effusions noted. Foot type and digits show no abnormalities. Bony prominences are unremarkable. Skin: No Porokeratosis. No infection or ulcers.  Diffuse asymptomatic callus   Diagnosis:  Tinea unguium, Pain in right toe, pain in left toes  Treatment & Plan Procedures and Treatment: Consent by patient was obtained for treatment procedures. The patient understood the discussion of treatment and procedures well. All questions were answered thoroughly reviewed. Debridement of mycotic and hypertrophic toenails, 1 through 5 bilateral and clearing of subungual debris. No ulceration, no infection noted.  Return Visit-Office Procedure: Patient instructed to return to the office for a follow up visit 3 months for continued evaluation and treatment.   Margree Gimbel DPM 

## 2015-09-09 ENCOUNTER — Emergency Department (HOSPITAL_COMMUNITY): Payer: Medicare Other

## 2015-09-09 ENCOUNTER — Encounter (HOSPITAL_COMMUNITY): Payer: Self-pay | Admitting: Emergency Medicine

## 2015-09-09 ENCOUNTER — Emergency Department (HOSPITAL_COMMUNITY)
Admission: EM | Admit: 2015-09-09 | Discharge: 2015-09-10 | Disposition: A | Discharge status: Home / Self Care | Payer: Medicare Other | Attending: Emergency Medicine | Admitting: Emergency Medicine

## 2015-09-09 DIAGNOSIS — Y999 Unspecified external cause status: Secondary | ICD-10-CM | POA: Diagnosis not present

## 2015-09-09 DIAGNOSIS — Z79899 Other long term (current) drug therapy: Secondary | ICD-10-CM | POA: Insufficient documentation

## 2015-09-09 DIAGNOSIS — Y92121 Bathroom in nursing home as the place of occurrence of the external cause: Secondary | ICD-10-CM | POA: Diagnosis not present

## 2015-09-09 DIAGNOSIS — Z7982 Long term (current) use of aspirin: Secondary | ICD-10-CM | POA: Insufficient documentation

## 2015-09-09 DIAGNOSIS — W19XXXA Unspecified fall, initial encounter: Secondary | ICD-10-CM | POA: Diagnosis not present

## 2015-09-09 DIAGNOSIS — E119 Type 2 diabetes mellitus without complications: Secondary | ICD-10-CM | POA: Insufficient documentation

## 2015-09-09 DIAGNOSIS — M542 Cervicalgia: Secondary | ICD-10-CM

## 2015-09-09 DIAGNOSIS — Y939 Activity, unspecified: Secondary | ICD-10-CM | POA: Diagnosis not present

## 2015-09-09 DIAGNOSIS — M25551 Pain in right hip: Secondary | ICD-10-CM | POA: Diagnosis not present

## 2015-09-09 DIAGNOSIS — F329 Major depressive disorder, single episode, unspecified: Secondary | ICD-10-CM | POA: Insufficient documentation

## 2015-09-09 DIAGNOSIS — M25559 Pain in unspecified hip: Secondary | ICD-10-CM

## 2015-09-09 DIAGNOSIS — G309 Alzheimer's disease, unspecified: Secondary | ICD-10-CM | POA: Insufficient documentation

## 2015-09-09 DIAGNOSIS — Z7984 Long term (current) use of oral hypoglycemic drugs: Secondary | ICD-10-CM | POA: Insufficient documentation

## 2015-09-09 DIAGNOSIS — M546 Pain in thoracic spine: Secondary | ICD-10-CM | POA: Diagnosis present

## 2015-09-09 MED ORDER — OXYCODONE-ACETAMINOPHEN 5-325 MG PO TABS
1.0000 | ORAL_TABLET | Freq: Once | ORAL | Status: AC
  Administered 2015-09-09: 1 via ORAL
  Filled 2015-09-09: qty 1
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
## 2015-09-09 NOTE — ED Notes (Signed)
Pt in X-Ray ?

## 2015-09-09 NOTE — ED Notes (Signed)
Pt transported to CT ?

## 2015-09-09 NOTE — Discharge Instructions (Signed)
Back Pain, Adult Patient may be given her routinely prescribed pain medications if needed for pain. She was given 1 Percocet in the emergency department. Back pain is very common in adults.The cause of back pain is rarely dangerous and the pain often gets better over time.The cause of your back pain may not be known. Some common causes of back pain include:  Strain of the muscles or ligaments supporting the spine.  Wear and tear (degeneration) of the spinal disks.  Arthritis.  Direct injury to the back. For many people, back pain may return. Since back pain is rarely dangerous, most people can learn to manage this condition on their own. HOME CARE INSTRUCTIONS Watch your back pain for any changes. The following actions may help to lessen any discomfort you are feeling:  Remain active. It is stressful on your back to sit or stand in one place for long periods of time. Do not sit, drive, or stand in one place for more than 30 minutes at a time. Take short walks on even surfaces as soon as you are able.Try to increase the length of time you walk each day.  Exercise regularly as directed by your health care provider. Exercise helps your back heal faster. It also helps avoid future injury by keeping your muscles strong and flexible.  Do not stay in bed.Resting more than 1-2 days can delay your recovery.  Pay attention to your body when you bend and lift. The most comfortable positions are those that put less stress on your recovering back. Always use proper lifting techniques, including:  Bending your knees.  Keeping the load close to your body.  Avoiding twisting.  Find a comfortable position to sleep. Use a firm mattress and lie on your side with your knees slightly bent. If you lie on your back, put a pillow under your knees.  Avoid feeling anxious or stressed.Stress increases muscle tension and can worsen back pain.It is important to recognize when you are anxious or stressed and  learn ways to manage it, such as with exercise.  Take medicines only as directed by your health care provider. Over-the-counter medicines to reduce pain and inflammation are often the most helpful.Your health care provider may prescribe muscle relaxant drugs.These medicines help dull your pain so you can more quickly return to your normal activities and healthy exercise.  Apply ice to the injured area:  Put ice in a plastic bag.  Place a towel between your skin and the bag.  Leave the ice on for 20 minutes, 2-3 times a day for the first 2-3 days. After that, ice and heat may be alternated to reduce pain and spasms.  Maintain a healthy weight. Excess weight puts extra stress on your back and makes it difficult to maintain good posture. SEEK MEDICAL CARE IF:  You have pain that is not relieved with rest or medicine.  You have increasing pain going down into the legs or buttocks.  You have pain that does not improve in one week.  You have night pain.  You lose weight.  You have a fever or chills. SEEK IMMEDIATE MEDICAL CARE IF:   You develop new bowel or bladder control problems.  You have unusual weakness or numbness in your arms or legs.  You develop nausea or vomiting.  You develop abdominal pain.  You feel faint.   This information is not intended to replace advice given to you by your health care provider. Make sure you discuss any questions you have  with your health care provider.   Document Released: 04/10/2005 Document Revised: 05/01/2014 Document Reviewed: 08/12/2013 Elsevier Interactive Patient Education 2016 Elsevier Inc[employer] Cervical Sprain A cervical sprain is an injury in the neck in which the strong, fibrous tissues (ligaments) that connect your neck bones stretch or tear. Cervical sprains can range from mild to severe. Severe cervical sprains can cause the neck vertebrae to be unstable. This can lead to damage of the spinal cord and can result in serious nervous  system problems. The amount of time it takes for a cervical sprain to get better depends on the cause and extent of the injury. Most cervical sprains heal in 1 to 3 weeks. CAUSES  Severe cervical sprains may be caused by:   Contact sport injuries (such as from football, rugby, wrestling, hockey, auto racing, gymnastics, diving, martial arts, or boxing).   Motor vehicle collisions.   Whiplash injuries. This is an injury from a sudden forward and backward whipping movement of the head and neck.  Falls.  Mild cervical sprains may be caused by:   Being in an awkward position, such as while cradling a telephone between your ear and shoulder.   Sitting in a chair that does not offer proper support.   Working at a poorly Marketing executive station.   Looking up or down for long periods of time.  SYMPTOMS   Pain, soreness, stiffness, or a burning sensation in the front, back, or sides of the neck. This discomfort may develop immediately after the injury or slowly, 24 hours or more after the injury.   Pain or tenderness directly in the middle of the back of the neck.   Shoulder or upper back pain.   Limited ability to move the neck.   Headache.   Dizziness.   Weakness, numbness, or tingling in the hands or arms.   Muscle spasms.   Difficulty swallowing or chewing.   Tenderness and swelling of the neck.  DIAGNOSIS  Most of the time your health care provider can diagnose a cervical sprain by taking your history and doing a physical exam. Your health care provider will ask about previous neck injuries and any known neck problems, such as arthritis in the neck. X-rays may be taken to find out if there are any other problems, such as with the bones of the neck. Other tests, such as a CT scan or MRI, may also be needed.  TREATMENT  Treatment depends on the severity of the cervical sprain. Mild sprains can be treated with rest, keeping the neck in place (immobilization),  and pain medicines. Severe cervical sprains are immediately immobilized. Further treatment is done to help with pain, muscle spasms, and other symptoms and may include:  Medicines, such as pain relievers, numbing medicines, or muscle relaxants.   Physical therapy. This may involve stretching exercises, strengthening exercises, and posture training. Exercises and improved posture can help stabilize the neck, strengthen muscles, and help stop symptoms from returning.  HOME CARE INSTRUCTIONS   Put ice on the injured area.   Put ice in a plastic bag.   Place a towel between your skin and the bag.   Leave the ice on for 15-20 minutes, 3-4 times a day.   If your injury was severe, you may have been given a cervical collar to wear. A cervical collar is a two-piece collar designed to keep your neck from moving while it heals.  Do not remove the collar unless instructed by your health care provider.  If  you have long hair, keep it outside of the collar.  Ask your health care provider before making any adjustments to your collar. Minor adjustments may be required over time to improve comfort and reduce pressure on your chin or on the back of your head.  Ifyou are allowed to remove the collar for cleaning or bathing, follow your health care provider's instructions on how to do so safely.  Keep your collar clean by wiping it with mild soap and water and drying it completely. If the collar you have been given includes removable pads, remove them every 1-2 days and hand wash them with soap and water. Allow them to air dry. They should be completely dry before you wear them in the collar.  If you are allowed to remove the collar for cleaning and bathing, wash and dry the skin of your neck. Check your skin for irritation or sores. If you see any, tell your health care provider.  Do not drive while wearing the collar.   Only take over-the-counter or prescription medicines for pain, discomfort,  or fever as directed by your health care provider.   Keep all follow-up appointments as directed by your health care provider.   Keep all physical therapy appointments as directed by your health care provider.   Make any needed adjustments to your workstation to promote good posture.   Avoid positions and activities that make your symptoms worse.   Warm up and stretch before being active to help prevent problems.  SEEK MEDICAL CARE IF:   Your pain is not controlled with medicine.   You are unable to decrease your pain medicine over time as planned.   Your activity level is not improving as expected.  SEEK IMMEDIATE MEDICAL CARE IF:   You develop any bleeding.  You develop stomach upset.  You have signs of an allergic reaction to your medicine.   Your symptoms get worse.   You develop new, unexplained symptoms.   You have numbness, tingling, weakness, or paralysis in any part of your body.  MAKE SURE YOU:   Understand these instructions.  Will watch your condition.  Will get help right away if you are not doing well or get worse.   This information is not intended to replace advice given to you by your health care provider. Make sure you discuss any questions you have with your health care provider.   Document Released: 02/05/2007 Document Revised: 04/15/2013 Document Reviewed: 10/16/2012 Elsevier Interactive Patient Education 2016 Elsevier Inc. Contusion A contusion is a deep bruise. Contusions are the result of a blunt injury to tissues and muscle fibers under the skin. The injury causes bleeding under the skin. The skin overlying the contusion may turn blue, purple, or yellow. Minor injuries will give you a painless contusion, but more severe contusions may stay painful and swollen for a few weeks.  CAUSES  This condition is usually caused by a blow, trauma, or direct force to an area of the body. SYMPTOMS  Symptoms of this condition  include:  Swelling of the injured area.  Pain and tenderness in the injured area.  Discoloration. The area may have redness and then turn blue, purple, or yellow. DIAGNOSIS  This condition is diagnosed based on a physical exam and medical history. An X-ray, CT scan, or MRI may be needed to determine if there are any associated injuries, such as broken bones (fractures). TREATMENT  Specific treatment for this condition depends on what area of the body was injured.  In general, the best treatment for a contusion is resting, icing, applying pressure to (compression), and elevating the injured area. This is often called the RICE strategy. Over-the-counter anti-inflammatory medicines may also be recommended for pain control.  HOME CARE INSTRUCTIONS   Rest the injured area.  If directed, apply ice to the injured area:  Put ice in a plastic bag.  Place a towel between your skin and the bag.  Leave the ice on for 20 minutes, 2-3 times per day.  If directed, apply light compression to the injured area using an elastic bandage. Make sure the bandage is not wrapped too tightly. Remove and reapply the bandage as directed by your health care provider.  If possible, raise (elevate) the injured area above the level of your heart while you are sitting or lying down.  Take over-the-counter and prescription medicines only as told by your health care provider. SEEK MEDICAL CARE IF:  Your symptoms do not improve after several days of treatment.  Your symptoms get worse.  You have difficulty moving the injured area. SEEK IMMEDIATE MEDICAL CARE IF:   You have severe pain.  You have numbness in a hand or foot.  Your hand or foot turns pale or cold.   This information is not intended to replace advice given to you by your health care provider. Make sure you discuss any questions you have with your health care provider.   Document Released: 01/18/2005 Document Revised: 12/30/2014 Document  Reviewed: 08/26/2014 Elsevier Interactive Patient Education [employer] Elsevier Inc[employer]

## 2015-09-09 NOTE — ED Notes (Signed)
MD at bedside. 

## 2015-09-09 NOTE — ED Notes (Signed)
Bed: [id number] Expected date:  Expected time:  Means of arrival:  Comments: EMS 82 [facility]/pain back of headand tailbone

## 2015-09-09 NOTE — ED Provider Notes (Signed)
CSN: [id number]     Arrival date & time 09/09/15  2018 History   First MD Initiated Contact with Patient 09/09/15 2032     Chief Complaint  Patient presents with  . Fall  . Back Pain     (Consider location/radiation/quality/duration/timing/severity/associated sxs/prior Treatment) HPI Patient had a fall at the nursing home. She lost her balance and fell between the toilet and the wall. There was no witnessed loss of consciousness. Patient thinks she may have hit her head. She complained of pain in her thoracic back and her cervical spine. She denies numbness or tingling. He does have Alzheimer's dementia but is alert and interactive. Family members are here to assist in history. He is at baseline mental status and function. Past Medical History  Diagnosis Date  . Dementia   . GERD (gastroesophageal reflux disease)   . Diabetes mellitus    Past Surgical History  Procedure Laterality Date  . Abdominal hysterectomy     No family history on file. Social History  Substance Use Topics  . Smoking status: Never Smoker   . Smokeless tobacco: None  . Alcohol Use: No   OB History    No data available     Review of Systems  10 Systems reviewed and are negative for acute change except as noted in the HPI.   Allergies  Codeine and Sulfa antibiotics  Home Medications   Prior to Admission medications   Medication Sig Start Date End Date Taking? Authorizing Provider  aspirin EC 81 MG tablet Take 81 mg by mouth daily.   Yes Historical Provider, MD  diclofenac sodium (VOLTAREN) 1 % GEL Apply 4 g topically 3 (three) times daily.   Yes Historical Provider, MD  donepezil (ARICEPT) 10 MG tablet Take 10 mg by mouth daily.    Yes Historical Provider, MD  famotidine (PEPCID) 20 MG tablet Take 20 mg by mouth daily.   Yes Historical Provider, MD  JANUVIA 100 MG tablet Take 100 mg by mouth daily.  11/12/13  Yes Historical Provider, MD  LEVEMIR 100 UNIT/ML injection Inject 16 Units into the skin  at bedtime.  09/18/13  Yes Historical Provider, MD  LYRICA 75 MG capsule Take 75 mg by mouth 2 (two) times daily.  11/10/13  Yes Historical Provider, MD  metFORMIN (GLUCOPHAGE-XR) 500 MG 24 hr tablet Take 500 mg by mouth daily with breakfast.  11/25/13  Yes Historical Provider, MD  NAMENDA XR 28 MG CP24 Take 28 mg by mouth daily.  11/12/13  Yes Historical Provider, MD  oxyCODONE-acetaminophen (PERCOCET/ROXICET) 5-325 MG per tablet Take 1 tablet by mouth 2 (two) times daily.    Yes Historical Provider, MD  oxyCODONE-acetaminophen (PERCOCET/ROXICET) 5-325 MG tablet Take 1 tablet by mouth every 6 (six) hours as needed for severe pain.   Yes Historical Provider, MD  pravastatin (PRAVACHOL) 10 MG tablet Take 10 mg by mouth daily.  11/12/13  Yes Historical Provider, MD  sertraline (ZOLOFT) 25 MG tablet Take 25 mg by mouth daily.   Yes Historical Provider, MD  vitamin B-12 (CYANOCOBALAMIN) 1000 MCG tablet Take 1,000 mcg by mouth daily.   Yes Historical Provider, MD  cephALEXin (KEFLEX) 500 MG capsule Take 1 capsule (500 mg total) by mouth 2 (two) times daily. Patient not taking: Reported on 09/09/2015 06/04/15   Tilden Fossa, MD  Vitamin D, Ergocalciferol, (DRISDOL) 50000 UNITS CAPS capsule Take 50,000 Units by mouth every 7 (seven) days.  11/12/13   Historical Provider, MD   BP 125/63 mmHg  Pulse 81  Temp(Src) 98.7 F (37.1 C) (Oral)  Resp 18  SpO2 97% Physical Exam  Constitutional: She is oriented to person, place, and time. She appears well-developed and well-nourished.  HENT:  Head: Normocephalic and atraumatic.  Eyes: EOM are normal. Pupils are equal, round, and reactive to light.  Neck: Neck supple.  Patient endorses point tenderness to palpation of the cervical spine from approximately C4-C7.  Cardiovascular: Normal rate, regular rhythm, normal heart sounds and intact distal pulses.   Pulmonary/Chest: Effort normal and breath sounds normal.  Abdominal: Soft. Bowel sounds are normal. She exhibits  no distension. There is no tenderness.  Musculoskeletal: Normal range of motion. She exhibits no edema.  Patient versus tenderness in the thoracic spine from approximately T5 to T 10. Range of motion of extremities is intact. No extremity deformities.  Neurological: She is oriented to person, place, and time. She has normal strength. She exhibits normal muscle tone. Coordination normal. GCS eye subscore is 4. GCS verbal subscore is 5. GCS motor subscore is 6.  Skin: Skin is warm, dry and intact.  Psychiatric: She has a normal mood and affect.    ED Course  Procedures (including critical care time) Labs Review Labs Reviewed - No data to display  Imaging Review Dg Thoracic Spine 2 View  09/09/2015  CLINICAL DATA:  Larey Seat this evening walking to the bathroom, upper back pain between shoulders radiating down back EXAM: THORACIC SPINE 2 VIEWS COMPARISON:  Chest radiographs 06/04/2015, cervical spine radiographs 08/29/2012 FINDINGS: Twelve pairs of ribs. Diffuse osseous demineralization. Broad based dextro convex thoracic scoliosis. Multilevel disc space narrowing and endplate spur formation. Vertebral body heights appear grossly maintained. No definite fracture, subluxation or bone destruction. Irregular inferior endplate T3 unchanged since 08/29/2012. Atherosclerotic calcification aorta. IMPRESSION: Degenerative disc disease changes thoracic spine with broad-based dextro convex scoliosis. No definite acute thoracic spine abnormalities. Electronically Signed   By: Ulyses Southward M.D.   On: 09/09/2015 22:08   Ct Head Wo Contrast  09/09/2015  CLINICAL DATA:  Fall. EXAM: CT HEAD WITHOUT CONTRAST CT CERVICAL SPINE WITHOUT CONTRAST TECHNIQUE: Multidetector CT imaging of the head and cervical spine was performed following the standard protocol without intravenous contrast. Multiplanar CT image reconstructions of the cervical spine were also generated. COMPARISON:  06/04/2015 FINDINGS: CT HEAD FINDINGS Mild  cerebral atrophy. No ventricular dilatation. Low-attenuation changes in the deep white matter consistent with small vessel ischemia. No mass effect or midline shift. No abnormal extra-axial fluid collections. Gray-white matter junctions are distinct. Basal cisterns are not effaced. No evidence of acute intracranial hemorrhage. No depressed skull fractures. Visualized paranasal sinuses and mastoid air cells are not opacified. Vascular calcifications. CT CERVICAL SPINE FINDINGS There is mild anterior subluxation of C3 on C4 and C7 on T1. Alignment is unchanged since the prior study and probably represents degenerative change. There are diffuse degenerative changes throughout the cervical spine with narrowed cervical interspaces and endplate hypertrophic changes. Degenerative changes throughout the cervical facet joints and at C1 to. C1-2 articulation is otherwise intact. No vertebral compression deformities. No prevertebral soft tissue swelling. No focal bone lesion or bone destruction. Soft tissues are unremarkable. Vascular calcifications. Patchy ground-glass changes in the lung apices may represent air trapping or edema. IMPRESSION: No acute intracranial abnormalities. Chronic atrophy and small vessel ischemic changes. Diffuse degenerative changes throughout the cervical spine. No change in alignment since prior study. No acute displaced fractures identified. Electronically Signed   By: Burman Nieves M.D.   On: 09/09/2015 22:39  Ct Cervical Spine Wo Contrast  09/09/2015  CLINICAL DATA:  Fall. EXAM: CT HEAD WITHOUT CONTRAST CT CERVICAL SPINE WITHOUT CONTRAST TECHNIQUE: Multidetector CT imaging of the head and cervical spine was performed following the standard protocol without intravenous contrast. Multiplanar CT image reconstructions of the cervical spine were also generated. COMPARISON:  06/04/2015 FINDINGS: CT HEAD FINDINGS Mild cerebral atrophy. No ventricular dilatation. Low-attenuation changes in the  deep white matter consistent with small vessel ischemia. No mass effect or midline shift. No abnormal extra-axial fluid collections. Gray-white matter junctions are distinct. Basal cisterns are not effaced. No evidence of acute intracranial hemorrhage. No depressed skull fractures. Visualized paranasal sinuses and mastoid air cells are not opacified. Vascular calcifications. CT CERVICAL SPINE FINDINGS There is mild anterior subluxation of C3 on C4 and C7 on T1. Alignment is unchanged since the prior study and probably represents degenerative change. There are diffuse degenerative changes throughout the cervical spine with narrowed cervical interspaces and endplate hypertrophic changes. Degenerative changes throughout the cervical facet joints and at C1 to. C1-2 articulation is otherwise intact. No vertebral compression deformities. No prevertebral soft tissue swelling. No focal bone lesion or bone destruction. Soft tissues are unremarkable. Vascular calcifications. Patchy ground-glass changes in the lung apices may represent air trapping or edema. IMPRESSION: No acute intracranial abnormalities. Chronic atrophy and small vessel ischemic changes. Diffuse degenerative changes throughout the cervical spine. No change in alignment since prior study. No acute displaced fractures identified. Electronically Signed   By: Burman NievesWilliam  Stevens M.D.   On: 09/09/2015 22:39   Dg Hip Unilat  With Pelvis 2-3 Views Right  09/09/2015  CLINICAL DATA:  RIGHT hip pain after falling today, initial encounter EXAM: DG HIP (WITH OR WITHOUT PELVIS) 2-3V RIGHT COMPARISON:  None FINDINGS: Diffuse osseous demineralization. Mild narrowing of the hip joints bilaterally greater on RIGHT. RIGHT hip joint spur formation. SI joints symmetric. Degenerative changes at pubic symphysis at well. No definite acute fracture, dislocation, or bone destruction. IMPRESSION: Osseous demineralization with degenerative changes RIGHT greater than LEFT hips and at  pubic symphysis. No definite acute bony abnormalities. Electronically Signed   By: Ulyses SouthwardMark  Boles M.D.   On: 09/09/2015 20:57   I have personally reviewed and evaluated these images and lab results as part of my medical decision-making.   EKG Interpretation None      MDM   Final diagnoses:  Fall, initial encounter  Bilateral thoracic back pain  Hip pain, unspecified laterality  Cervical pain   Patient is alert and functionally intact. CT does not show intracranial bleeding or fracture of the cervical spine. Plain film x-rays do not identify fracture either. At this time, with patient alert and interactive without signs of distress she will be returned to her nursing home care. Family members are here to assist her.    Arby BarretteMarcy Andrews Tener, MD 09/09/15 [phone]

## 2015-12-07 ENCOUNTER — Encounter: Payer: Self-pay | Admitting: Podiatry

## 2015-12-07 ENCOUNTER — Ambulatory Visit (INDEPENDENT_AMBULATORY_CARE_PROVIDER_SITE_OTHER): Payer: Medicare Other | Admitting: Podiatry

## 2015-12-07 DIAGNOSIS — B351 Tinea unguium: Secondary | ICD-10-CM | POA: Diagnosis not present

## 2015-12-07 DIAGNOSIS — M79676 Pain in unspecified toe(s): Secondary | ICD-10-CM | POA: Diagnosis not present

## 2015-12-07 DIAGNOSIS — E119 Type 2 diabetes mellitus without complications: Secondary | ICD-10-CM

## 2015-12-07 NOTE — Progress Notes (Signed)
Patient ID: Felicia Payne, female   DOB: 06/25/1930, 82 y.o.   MRN: [id number] Complaint:  Visit Type: Patient returns to my office for continued preventative foot care services. Complaint: Patient states" my nails have grown long and thick and become painful to walk and wear shoes" Patient has been diagnosed with DM with no foot complications.She presents for preventative foot care services. No changes to ROS  Podiatric Exam: Vascular: dorsalis pedis and posterior tibial pulses are palpable bilateral. Capillary return is immediate. Temperature gradient is WNL. Skin turgor WNL  Sensorium: Normal Semmes Weinstein monofilament test. Normal tactile sensation bilaterally. Nail Exam: Pt has thick disfigured discolored nails with subungual debris noted bilateral entire nail hallux through fifth toenails Ulcer Exam: There is no evidence of ulcer or pre-ulcerative changes or infection. Orthopedic Exam: Muscle tone and strength are WNL. No limitations in general ROM. No crepitus or effusions noted. Foot type and digits show no abnormalities. Bony prominences are unremarkable. Skin: No Porokeratosis. No infection or ulcers.  Diffuse asymptomatic callus   Diagnosis:  Tinea unguium, Pain in right toe, pain in left toes  Treatment & Plan Procedures and Treatment: Consent by patient was obtained for treatment procedures. The patient understood the discussion of treatment and procedures well. All questions were answered thoroughly reviewed. Debridement of mycotic and hypertrophic toenails, 1 through 5 bilateral and clearing of subungual debris. No ulceration, no infection noted.  Return Visit-Office Procedure: Patient instructed to return to the office for a follow up visit 3 months for continued evaluation and treatment.   Helane GuntherGregory Loa Idler DPM

## 2016-03-14 ENCOUNTER — Ambulatory Visit (INDEPENDENT_AMBULATORY_CARE_PROVIDER_SITE_OTHER): Payer: Medicare Other | Admitting: Podiatry

## 2016-03-14 DIAGNOSIS — E119 Type 2 diabetes mellitus without complications: Secondary | ICD-10-CM

## 2016-03-14 DIAGNOSIS — M79676 Pain in unspecified toe(s): Secondary | ICD-10-CM | POA: Diagnosis not present

## 2016-03-14 DIAGNOSIS — B351 Tinea unguium: Secondary | ICD-10-CM | POA: Diagnosis not present

## 2016-03-14 NOTE — Progress Notes (Signed)
Patient ID: Felicia Payne, female   DOB: 08/23/1930, 82 y.o.   MRN: [id number] Complaint:  Visit Type: Patient returns to my office for continued preventative foot care services. Complaint: Patient states" my nails have grown long and thick and become painful to walk and wear shoes" Patient has been diagnosed with DM with no foot complications.She presents for preventative foot care services. No changes to ROS  Podiatric Exam: Vascular: dorsalis pedis and posterior tibial pulses are palpable bilateral. Capillary return is immediate. Temperature gradient is WNL. Skin turgor WNL  Sensorium: Normal Semmes Weinstein monofilament test. Normal tactile sensation bilaterally. Nail Exam: Pt has thick disfigured discolored nails with subungual debris noted bilateral entire nail hallux through fifth toenails Ulcer Exam: There is no evidence of ulcer or pre-ulcerative changes or infection. Orthopedic Exam: Muscle tone and strength are WNL. No limitations in general ROM. No crepitus or effusions noted. Foot type and digits show no abnormalities. Bony prominences are unremarkable. Skin: No Porokeratosis. No infection or ulcers.  Diffuse asymptomatic callus   Diagnosis:  Tinea unguium, Pain in right toe, pain in left toes  Treatment & Plan Procedures and Treatment: Consent by patient was obtained for treatment procedures. The patient understood the discussion of treatment and procedures well. All questions were answered thoroughly reviewed. Debridement of mycotic and hypertrophic toenails, 1 through 5 bilateral and clearing of subungual debris. No ulceration, no infection noted.  Return Visit-Office Procedure: Patient instructed to return to the office for a follow up visit 3 months for continued evaluation and treatment.   Savvy Peeters DPM 

## 2016-03-21 ENCOUNTER — Ambulatory Visit
Admission: RE | Admit: 2016-03-21 | Discharge: 2016-03-21 | Disposition: A | Discharge status: Home / Self Care | Payer: Medicare Other | Source: Ambulatory Visit | Attending: Geriatric Medicine | Admitting: Geriatric Medicine

## 2016-03-21 ENCOUNTER — Other Ambulatory Visit: Payer: Self-pay | Admitting: Geriatric Medicine

## 2016-03-21 DIAGNOSIS — M545 Low back pain: Secondary | ICD-10-CM

## 2016-04-12 ENCOUNTER — Other Ambulatory Visit: Payer: Self-pay | Admitting: Geriatric Medicine

## 2016-04-12 DIAGNOSIS — R52 Pain, unspecified: Secondary | ICD-10-CM

## 2016-04-13 ENCOUNTER — Ambulatory Visit
Admission: RE | Admit: 2016-04-13 | Discharge: 2016-04-13 | Disposition: A | Discharge status: Home / Self Care | Payer: Medicare Other | Source: Ambulatory Visit | Attending: Geriatric Medicine | Admitting: Geriatric Medicine

## 2016-04-13 DIAGNOSIS — R52 Pain, unspecified: Secondary | ICD-10-CM

## 2016-05-26 ENCOUNTER — Other Ambulatory Visit: Payer: Self-pay | Admitting: Geriatric Medicine

## 2016-05-26 DIAGNOSIS — M25532 Pain in left wrist: Secondary | ICD-10-CM

## 2016-06-01 ENCOUNTER — Ambulatory Visit
Admission: RE | Admit: 2016-06-01 | Discharge: 2016-06-01 | Disposition: A | Discharge status: Home / Self Care | Payer: Medicare Other | Source: Ambulatory Visit | Attending: Geriatric Medicine | Admitting: Geriatric Medicine

## 2016-06-01 DIAGNOSIS — M25532 Pain in left wrist: Secondary | ICD-10-CM

## 2016-06-13 ENCOUNTER — Ambulatory Visit (INDEPENDENT_AMBULATORY_CARE_PROVIDER_SITE_OTHER): Payer: Medicare Other | Admitting: Podiatry

## 2016-06-13 ENCOUNTER — Encounter: Payer: Self-pay | Admitting: Podiatry

## 2016-06-13 DIAGNOSIS — B351 Tinea unguium: Secondary | ICD-10-CM

## 2016-06-13 DIAGNOSIS — M79676 Pain in unspecified toe(s): Secondary | ICD-10-CM

## 2016-06-13 DIAGNOSIS — E119 Type 2 diabetes mellitus without complications: Secondary | ICD-10-CM

## 2016-06-13 NOTE — Progress Notes (Signed)
Patient ID: Felicia Payne, female   DOB: 02/12/1931, 83 y.o.   MRN: [id number] Complaint:  Visit Type: Patient returns to my office for continued preventative foot care services. Complaint: Patient states" my nails have grown long and thick and become painful to walk and wear shoes" Patient has been diagnosed with DM with no foot complications.She presents for preventative foot care services. No changes to ROS  Podiatric Exam: Vascular: dorsalis pedis and posterior tibial pulses are palpable bilateral. Capillary return is immediate. Temperature gradient is WNL. Skin turgor WNL  Sensorium: Normal Semmes Weinstein monofilament test. Normal tactile sensation bilaterally. Nail Exam: Pt has thick disfigured discolored nails with subungual debris noted bilateral entire nail hallux through fifth toenails Ulcer Exam: There is no evidence of ulcer or pre-ulcerative changes or infection. Orthopedic Exam: Muscle tone and strength are WNL. No limitations in general ROM. No crepitus or effusions noted. Foot type and digits show no abnormalities. Bony prominences are unremarkable. Skin: No Porokeratosis. No infection or ulcers.  Diffuse asymptomatic callus   Diagnosis:  Tinea unguium, Pain in right toe, pain in left toes  Treatment & Plan Procedures and Treatment: Consent by patient was obtained for treatment procedures. The patient understood the discussion of treatment and procedures well. All questions were answered thoroughly reviewed. Debridement of mycotic and hypertrophic toenails, 1 through 5 bilateral and clearing of subungual debris. No ulceration, no infection noted.  Return Visit-Office Procedure: Patient instructed to return to the office for a follow up visit 3 months for continued evaluation and treatment.   Helane GuntherGregory Taye Cato DPM

## 2016-09-12 ENCOUNTER — Encounter: Payer: Self-pay | Admitting: Podiatry

## 2016-09-12 ENCOUNTER — Ambulatory Visit (INDEPENDENT_AMBULATORY_CARE_PROVIDER_SITE_OTHER): Payer: Medicare Other | Admitting: Podiatry

## 2016-09-12 DIAGNOSIS — B351 Tinea unguium: Secondary | ICD-10-CM | POA: Diagnosis not present

## 2016-09-12 DIAGNOSIS — E119 Type 2 diabetes mellitus without complications: Secondary | ICD-10-CM

## 2016-09-12 DIAGNOSIS — M79676 Pain in unspecified toe(s): Secondary | ICD-10-CM | POA: Diagnosis not present

## 2016-09-12 NOTE — Progress Notes (Signed)
Patient ID: Felicia Payne, female   DOB: 04/19/1931, 83 y.o.   MRN: [id number] Complaint:  Visit Type: Patient returns to my office for continued preventative foot care services. Complaint: Patient states" my nails have grown long and thick and become painful to walk and wear shoes" Patient has been diagnosed with DM with no foot complications.She presents for preventative foot care services. No changes to ROS  Podiatric Exam: Vascular: dorsalis pedis and posterior tibial pulses are palpable bilateral. Capillary return is immediate. Temperature gradient is WNL. Skin turgor WNL  Sensorium: Normal Semmes Weinstein monofilament test. Normal tactile sensation bilaterally. Nail Exam: Pt has thick disfigured discolored nails with subungual debris noted bilateral entire nail hallux through fifth toenails Ulcer Exam: There is no evidence of ulcer or pre-ulcerative changes or infection. Orthopedic Exam: Muscle tone and strength are WNL. No limitations in general ROM. No crepitus or effusions noted. Foot type and digits show no abnormalities. Bony prominences are unremarkable. Skin: No Porokeratosis. No infection or ulcers.    Diagnosis:  Tinea unguium, Pain in right toe, pain in left toes  Treatment & Plan Procedures and Treatment: Consent by patient was obtained for treatment procedures. The patient understood the discussion of treatment and procedures well. All questions were answered thoroughly reviewed. Debridement of mycotic and hypertrophic toenails, 1 through 5 bilateral and clearing of subungual debris. No ulceration, no infection noted.  Return Visit-Office Procedure: Patient instructed to return to the office for a follow up visit 3 months for continued evaluation and treatment.   Johnpatrick Jenny DPM 

## 2016-12-12 ENCOUNTER — Encounter: Payer: Self-pay | Admitting: Podiatry

## 2016-12-12 ENCOUNTER — Ambulatory Visit (INDEPENDENT_AMBULATORY_CARE_PROVIDER_SITE_OTHER): Payer: Medicare Other | Admitting: Podiatry

## 2016-12-12 DIAGNOSIS — M79676 Pain in unspecified toe(s): Secondary | ICD-10-CM

## 2016-12-12 DIAGNOSIS — E119 Type 2 diabetes mellitus without complications: Secondary | ICD-10-CM

## 2016-12-12 DIAGNOSIS — B351 Tinea unguium: Secondary | ICD-10-CM | POA: Diagnosis not present

## 2016-12-12 NOTE — Progress Notes (Signed)
Patient ID: Felicia Payne, female   DOB: [birth year], 83 y.o.   MRN: [id number] Complaint:  Visit Type: Patient returns to my office for continued preventative foot care services. Complaint: Patient states" my nails have grown long and thick and become painful to walk and wear shoes" Patient has been diagnosed with DM with no foot complications.She presents for preventative foot care services. No changes to ROS  Podiatric Exam: Vascular: dorsalis pedis and posterior tibial pulses are palpable bilateral. Capillary return is immediate. Temperature gradient is WNL. Skin turgor WNL  Sensorium: Normal Semmes Weinstein monofilament test. Normal tactile sensation bilaterally. Nail Exam: Pt has thick disfigured discolored nails with subungual debris noted bilateral entire nail hallux through fifth toenails Ulcer Exam: There is no evidence of ulcer or pre-ulcerative changes or infection. Orthopedic Exam: Muscle tone and strength are WNL. No limitations in general ROM. No crepitus or effusions noted. Foot type and digits show no abnormalities. Bony prominences are unremarkable. Skin: No Porokeratosis. No infection or ulcers.    Diagnosis:  Tinea unguium, Pain in right toe, pain in left toes  Treatment & Plan Procedures and Treatment: Consent by patient was obtained for treatment procedures. The patient understood the discussion of treatment and procedures well. All questions were answered thoroughly reviewed. Debridement of mycotic and hypertrophic toenails, 1 through 5 bilateral and clearing of subungual debris. No ulceration, no infection noted.  Return Visit-Office Procedure: Patient instructed to return to the office for a follow up visit 3 months for continued evaluation and treatment.   Helane Gunther DPM

## 2017-03-14 ENCOUNTER — Ambulatory Visit (INDEPENDENT_AMBULATORY_CARE_PROVIDER_SITE_OTHER): Payer: Medicare Other | Admitting: Podiatry

## 2017-03-14 ENCOUNTER — Encounter: Payer: Self-pay | Admitting: Podiatry

## 2017-03-14 DIAGNOSIS — M79676 Pain in unspecified toe(s): Secondary | ICD-10-CM | POA: Diagnosis not present

## 2017-03-14 DIAGNOSIS — B351 Tinea unguium: Secondary | ICD-10-CM | POA: Diagnosis not present

## 2017-03-14 DIAGNOSIS — E119 Type 2 diabetes mellitus without complications: Secondary | ICD-10-CM

## 2017-03-14 NOTE — Progress Notes (Addendum)
Patient ID: Felicia Payne, female   DOB: 05/18/1930, 83 y.o.   MRN: [id number] Complaint:  Visit Type: Patient returns to my office for continued preventative foot care services. Complaint: Patient states" my nails have grown long and thick and become painful to walk and wear shoes" Patient has been diagnosed with DM with no foot complications.She presents for preventative foot care services. No changes to ROS  Podiatric Exam: Vascular: dorsalis pedis and posterior tibial pulses are palpable bilateral. Capillary return is immediate. Temperature gradient is WNL. Skin turgor WNL  Sensorium: Normal Semmes Weinstein monofilament test. Normal tactile sensation bilaterally. Nail Exam: Pt has thick disfigured discolored nails with subungual debris noted bilateral entire nail hallux through fifth toenails Ulcer Exam: There is no evidence of ulcer or pre-ulcerative changes or infection. Orthopedic Exam: Muscle tone and strength are WNL. No limitations in general ROM. No crepitus or effusions noted. Foot type and digits show no abnormalities. Bony prominences are unremarkable. Skin: No Porokeratosis. No infection or ulcers.    Diagnosis:  Tinea unguium, Pain in right toe, pain in left toes  Treatment & Plan Procedures and Treatment: Consent by patient was obtained for treatment procedures. The patient understood the discussion of treatment and procedures well. All questions were answered thoroughly reviewed. Debridement of mycotic and hypertrophic toenails, 1 through 5 bilateral and clearing of subungual debris. No ulceration, no infection noted. ABN signed for 2018. Return Visit-Office Procedure: Patient instructed to return to the office for a follow up visit 3 months for continued evaluation and treatment.   Helane GuntherGregory Margee Trentham DPM

## 2017-05-20 IMAGING — CT CT HIP*R* W/O CM
3 of 7 series · 5 of 33 positions shown, 7 images · non-contrast
Comparison: Radiographs dated 09/09/2015

CLINICAL DATA: Five years of chronic right hip and right knee pain.
Alzheimer's disease.

EXAM:
CT OF THE RIGHT HIP WITHOUT CONTRAST
CT OF THE RIGHT KNEE WITHOUT CONTRAST
TECHNIQUE: Multidetector CT imaging of the right hip was performed according to
the standard protocol. Multiplanar CT image reconstructions were
also generated.
Multidetector CT imaging of the right knee was performed according
to the standard protocol. Multiplanar CT image reconstructions were

[Series 9: knee soft · axial · 0.39mm/px · z∈[-683,-476]mm · 3 of 163 slices shown, 4 images]
[im 41/163  soft-tissue]
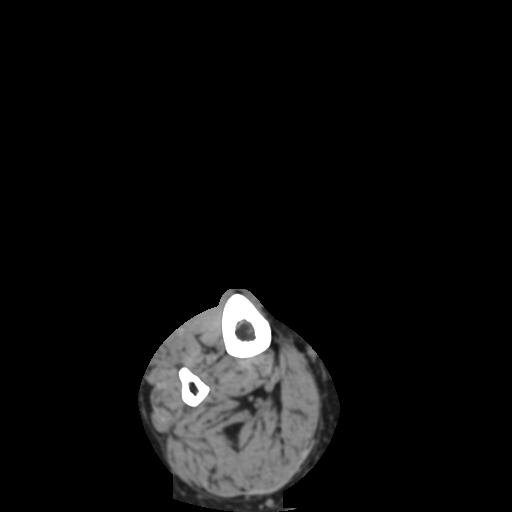
[im 41/163  bone]
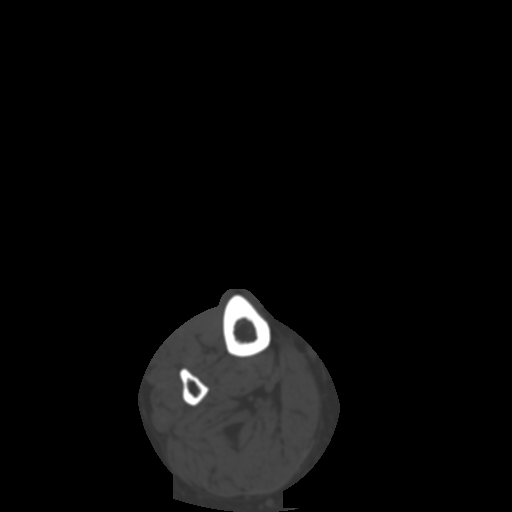
[im 82/163  soft-tissue]
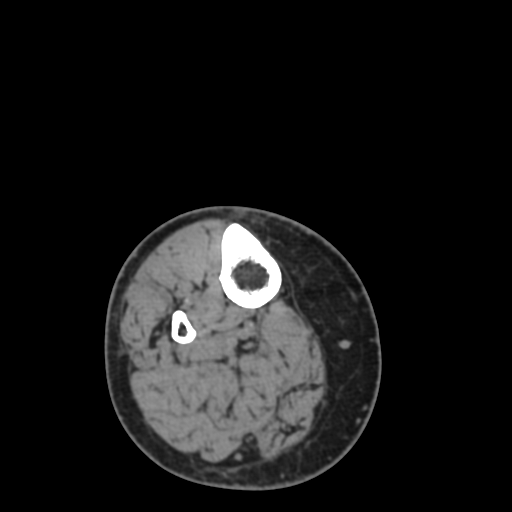
[im 122/163  soft-tissue]
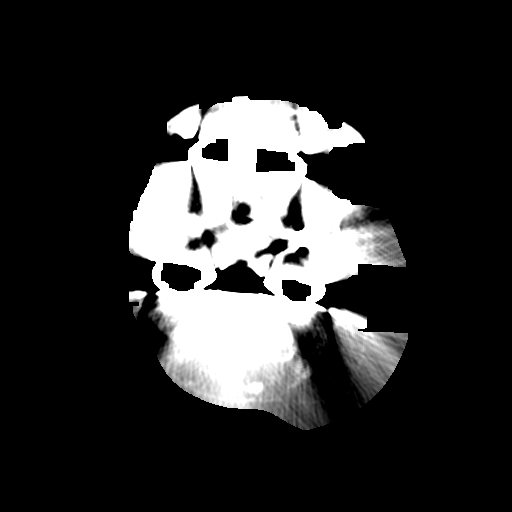

[Series 300: sag soft · sagittal · 0.69mm/px · 1 of 87 slices shown, 2 images]
[im 44/87  soft-tissue]
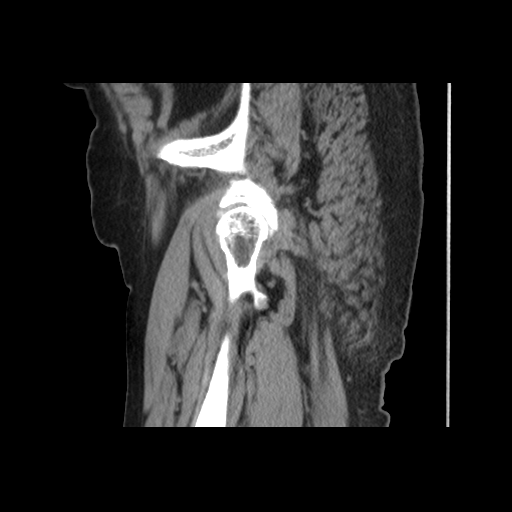
[im 44/87  bone]
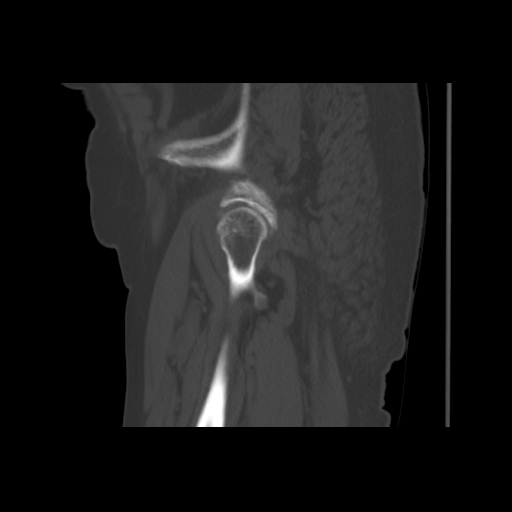

[Series 301: cor soft · coronal · 0.69mm/px · 1 of 87 slices shown]
[im 35/87  bone]
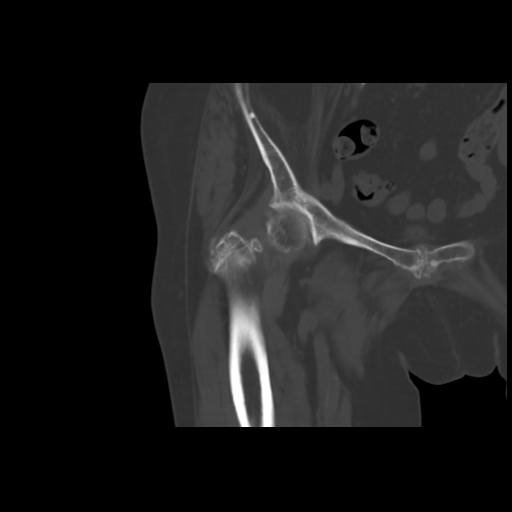

[5 of 33 positions shown; findings below may reference images not displayed]

FINDINGS: CT RIGHT HIP:

Bones/Joint/Cartilage

Chondrocalcinosis in the right sacroiliac joint with associated
spurring and mild subcortical sclerosis.

Prominent axial and moderate craniocaudad degenerative loss of
articular space in the right hip with prominent spurring of the
acetabulum and femoral head. Acetabular labral chondrocalcinosis.

There is a small bone island in the right acetabular roof.

No significant hip joint effusion on the right. No regional
bursitis.

Ligaments

Suboptimally assessed by CT.

Muscles and Tendons

There is some regional muscular atrophy on the right most notably of
the iliacus and gluteus minimus muscles. There is some asymmetric
prominence of the right piriformis compared to the left but no
sciatic notch impingement.

Faint calcifications in the right adductor aponeurosis at the pubis
into the pubic symphysis and right hamstring tendon.

Soft tissues

Iliac atherosclerosis. Sigmoid diverticulosis. Low position of the
anorectal junction compatible with pelvic floor laxity.

CT RIGHT KNEE:

Bones/Joint/Cartilage

Total knee prosthesis with associated streak artifact. Satisfactory
positioning of the tibial tray and femoral components of the
prosthesis without abnormal lucency along the metal/VAC relate or
methacrylate -bone interfaces. Satisfactory positioning of the
palmar liner of the tibial tray. I do not observe a fracture. There
is a partially obscured 1.2 cm ossicle along the lateral portion of
the patella, because portions of this are obscured I am not entirely
certain that the ossicle is fully well corticated, although I favor
it being well corticated. This could be bipartite patella or an old
fragmented osteophyte, I favor the latter.

Suspected trace knee effusion with some faint calcifications along
the synovial margin.

At the lower margin of imaging there is the top of a plate and screw
fixator along the fibular shaft.

Ligaments

Suboptimally assessed by CT.

Muscles and Tendons

There is some suture material in the distal quadriceps tendon. Mild
regional muscular atrophy. No Baker's cyst identified.
Atherosclerotic calcification noted.

Soft tissues

Unremarkable
IMPRESSION: 1. Moderate to severe arthropathy of the right hip with loss of
articular space, spurring, and chondrocalcinosis potentially
manifestation of CPPD arthropathy or osteoarthritis. No significant
hip joint effusion identified.
2. Pelvic floor laxity with low position of the anorectal junction.
3. Right total knee prosthesis without complicating feature aside
from a suspect trace knee effusion.
4. Atherosclerosis.
5. Sigmoid diverticulosis.

## 2017-06-20 ENCOUNTER — Ambulatory Visit (INDEPENDENT_AMBULATORY_CARE_PROVIDER_SITE_OTHER): Payer: Medicare Other | Admitting: Podiatry

## 2017-06-20 ENCOUNTER — Encounter: Payer: Self-pay | Admitting: Podiatry

## 2017-06-20 DIAGNOSIS — M79676 Pain in unspecified toe(s): Secondary | ICD-10-CM

## 2017-06-20 DIAGNOSIS — E119 Type 2 diabetes mellitus without complications: Secondary | ICD-10-CM

## 2017-06-20 DIAGNOSIS — B351 Tinea unguium: Secondary | ICD-10-CM | POA: Diagnosis not present

## 2017-06-20 NOTE — Progress Notes (Signed)
Patient ID: Felicia Payne, female   DOB: 06/18/1930, 84 y.o.   MRN: [id number] Complaint:  Visit Type: Patient returns to my office for continued preventative foot care services. Complaint: Patient states" my nails have grown long and thick and become painful to walk and wear shoes" Patient has been diagnosed with DM with no foot complications.She presents for preventative foot care services. No changes to ROS  Podiatric Exam: Vascular: dorsalis pedis and posterior tibial pulses are palpable bilateral. Capillary return is immediate. Temperature gradient is WNL. Skin turgor WNL  Sensorium: Normal Semmes Weinstein monofilament test. Normal tactile sensation bilaterally. Nail Exam: Pt has thick disfigured discolored nails with subungual debris noted bilateral entire nail hallux through fifth toenails Ulcer Exam: There is no evidence of ulcer or pre-ulcerative changes or infection. Orthopedic Exam: Muscle tone and strength are WNL. No limitations in general ROM. No crepitus or effusions noted. Foot type and digits show no abnormalities. Bony prominences are unremarkable. Skin: No Porokeratosis. No infection or ulcers.    Diagnosis:  Tinea unguium, Pain in right toe, pain in left toes  Treatment & Plan Procedures and Treatment: Consent by patient was obtained for treatment procedures. The patient understood the discussion of treatment and procedures well. All questions were answered thoroughly reviewed. Debridement of mycotic and hypertrophic toenails, 1 through 5 bilateral and clearing of subungual debris. No ulceration, no infection noted. ABN signed for 2019. Return Visit-Office Procedure: Patient instructed to return to the office for a follow up visit 3 months for continued evaluation and treatment.   Sheniah Supak DPM 

## 2017-09-19 ENCOUNTER — Ambulatory Visit (INDEPENDENT_AMBULATORY_CARE_PROVIDER_SITE_OTHER): Payer: Medicare Other | Admitting: Podiatry

## 2017-09-19 ENCOUNTER — Encounter: Payer: Self-pay | Admitting: Podiatry

## 2017-09-19 DIAGNOSIS — M79676 Pain in unspecified toe(s): Secondary | ICD-10-CM | POA: Diagnosis not present

## 2017-09-19 DIAGNOSIS — B351 Tinea unguium: Secondary | ICD-10-CM | POA: Diagnosis not present

## 2017-09-19 DIAGNOSIS — E119 Type 2 diabetes mellitus without complications: Secondary | ICD-10-CM

## 2017-09-19 NOTE — Progress Notes (Signed)
Patient ID: Felicia Payne, female   DOB: 12/28/1930, 84 y.o.   MRN: [id number] Complaint:  Visit Type: Patient returns to my office for continued preventative foot care services. Complaint: Patient states" my nails have grown long and thick and become painful to walk and wear shoes" Patient has been diagnosed with DM with no foot complications.She presents for preventative foot care services. No changes to ROS  Podiatric Exam: Vascular: dorsalis pedis and posterior tibial pulses are palpable bilateral. Capillary return is immediate. Temperature gradient is WNL. Skin turgor WNL  Sensorium: Normal Semmes Weinstein monofilament test. Normal tactile sensation bilaterally. Nail Exam: Pt has thick disfigured discolored nails with subungual debris noted bilateral entire nail hallux through fifth toenails Ulcer Exam: There is no evidence of ulcer or pre-ulcerative changes or infection. Orthopedic Exam: Muscle tone and strength are WNL. No limitations in general ROM. No crepitus or effusions noted. Foot type and digits show no abnormalities. Bony prominences are unremarkable. Skin: No Porokeratosis. No infection or ulcers.    Diagnosis:  Tinea unguium, Pain in right toe, pain in left toes  Treatment & Plan Procedures and Treatment: Consent by patient was obtained for treatment procedures. The patient understood the discussion of treatment and procedures well. All questions were answered thoroughly reviewed. Debridement of mycotic and hypertrophic toenails, 1 through 5 bilateral and clearing of subungual debris. No ulceration, no infection noted. ABN signed for 2019. Return Visit-Office Procedure: Patient instructed to return to the office for a follow up visit 3 months for continued evaluation and treatment.   Nyeshia Mysliwiec DPM 

## 2017-12-19 ENCOUNTER — Encounter: Payer: Self-pay | Admitting: Podiatry

## 2017-12-19 ENCOUNTER — Ambulatory Visit (INDEPENDENT_AMBULATORY_CARE_PROVIDER_SITE_OTHER): Payer: Medicare Other | Admitting: Podiatry

## 2017-12-19 DIAGNOSIS — E119 Type 2 diabetes mellitus without complications: Secondary | ICD-10-CM

## 2017-12-19 DIAGNOSIS — M79676 Pain in unspecified toe(s): Secondary | ICD-10-CM | POA: Diagnosis not present

## 2017-12-19 DIAGNOSIS — B351 Tinea unguium: Secondary | ICD-10-CM

## 2017-12-19 NOTE — Progress Notes (Signed)
Patient ID: Felicia Payne, female   DOB: 11/18/1930, 84 y.o.   MRN: [id number] Complaint:  Visit Type: Patient returns to my office for continued preventative foot care services. Complaint: Patient states" my nails have grown long and thick and become painful to walk and wear shoes" Patient has been diagnosed with DM with no foot complications.She presents for preventative foot care services. No changes to ROS  Podiatric Exam: Vascular: dorsalis pedis and posterior tibial pulses are palpable bilateral. Capillary return is immediate. Temperature gradient is WNL. Skin turgor WNL  Sensorium: Normal Semmes Weinstein monofilament test. Normal tactile sensation bilaterally. Nail Exam: Pt has thick disfigured discolored nails with subungual debris noted bilateral entire nail hallux through fifth toenails Ulcer Exam: There is no evidence of ulcer or pre-ulcerative changes or infection. Orthopedic Exam: Muscle tone and strength are WNL. No limitations in general ROM. No crepitus or effusions noted. Foot type and digits show no abnormalities. Bony prominences are unremarkable. Skin: No Porokeratosis. No infection or ulcers.    Diagnosis:  Tinea unguium, Pain in right toe, pain in left toes  Treatment & Plan Procedures and Treatment: Consent by patient was obtained for treatment procedures. The patient understood the discussion of treatment and procedures well. All questions were answered thoroughly reviewed. Debridement of mycotic and hypertrophic toenails, 1 through 5 bilateral and clearing of subungual debris. No ulceration, no infection noted. ABN signed for 2019. Return Visit-Office Procedure: Patient instructed to return to the office for a follow up visit 3 months for continued evaluation and treatment.   Marvelyn Bouchillon DPM 

## 2018-03-20 ENCOUNTER — Encounter: Payer: Self-pay | Admitting: Podiatry

## 2018-03-20 ENCOUNTER — Ambulatory Visit (INDEPENDENT_AMBULATORY_CARE_PROVIDER_SITE_OTHER): Payer: Medicare Other | Admitting: Podiatry

## 2018-03-20 DIAGNOSIS — M79676 Pain in unspecified toe(s): Secondary | ICD-10-CM

## 2018-03-20 DIAGNOSIS — B351 Tinea unguium: Secondary | ICD-10-CM

## 2018-03-20 DIAGNOSIS — E119 Type 2 diabetes mellitus without complications: Secondary | ICD-10-CM | POA: Diagnosis not present

## 2018-03-20 NOTE — Progress Notes (Signed)
Patient ID: Felicia Payne, female   DOB: 07/04/1930, 84 y.o.   MRN: [id number] Complaint:  Visit Type: Patient returns to my office for continued preventative foot care services. Complaint: Patient states" my nails have grown long and thick and become painful to walk and wear shoes" Patient has been diagnosed with DM with no foot complications.She presents for preventative foot care services. No changes to ROS  Podiatric Exam: Vascular: dorsalis pedis and posterior tibial pulses are palpable bilateral. Capillary return is immediate. Temperature gradient is WNL. Skin turgor WNL  Sensorium: Normal Semmes Weinstein monofilament test. Normal tactile sensation bilaterally. Nail Exam: Pt has thick disfigured discolored nails with subungual debris noted bilateral entire nail hallux through fifth toenails Ulcer Exam: There is no evidence of ulcer or pre-ulcerative changes or infection. Orthopedic Exam: Muscle tone and strength are WNL. No limitations in general ROM. No crepitus or effusions noted. Foot type and digits show no abnormalities. Bony prominences are unremarkable. Skin: No Porokeratosis. No infection or ulcers.    Diagnosis:  Tinea unguium, Pain in right toe, pain in left toes  Treatment & Plan Procedures and Treatment: Consent by patient was obtained for treatment procedures. The patient understood the discussion of treatment and procedures well. All questions were answered thoroughly reviewed. Debridement of mycotic and hypertrophic toenails, 1 through 5 bilateral and clearing of subungual debris. No ulceration, no infection noted. ABN signed for 2019. Return Visit-Office Procedure: Patient instructed to return to the office for a follow up visit 3 months for continued evaluation and treatment.   Helane GuntherGregory Braidon Chermak DPM

## 2018-06-19 ENCOUNTER — Ambulatory Visit (INDEPENDENT_AMBULATORY_CARE_PROVIDER_SITE_OTHER): Payer: Medicare Other | Admitting: Podiatry

## 2018-06-19 ENCOUNTER — Encounter: Payer: Self-pay | Admitting: Podiatry

## 2018-06-19 DIAGNOSIS — M79676 Pain in unspecified toe(s): Secondary | ICD-10-CM

## 2018-06-19 DIAGNOSIS — E119 Type 2 diabetes mellitus without complications: Secondary | ICD-10-CM

## 2018-06-19 DIAGNOSIS — B351 Tinea unguium: Secondary | ICD-10-CM | POA: Diagnosis not present

## 2018-06-19 NOTE — Progress Notes (Signed)
Patient ID: Felicia Payne, female   DOB: [birth year], 85 y.o.   MRN: [id number] Complaint:  Visit Type: Patient returns to my office for continued preventative foot care services. Complaint: Patient states" my nails have grown long and thick and become painful to walk and wear shoes" Patient has been diagnosed with DM with no foot complications.She presents for preventative foot care services. No changes to ROS  Podiatric Exam: Vascular: dorsalis pedis and posterior tibial pulses are palpable bilateral. Capillary return is immediate. Temperature gradient is WNL. Skin turgor WNL  Sensorium: Normal Semmes Weinstein monofilament test. Normal tactile sensation bilaterally. Nail Exam: Pt has thick disfigured discolored nails with subungual debris noted bilateral entire nail hallux through fifth toenails Ulcer Exam: There is no evidence of ulcer or pre-ulcerative changes or infection. Orthopedic Exam: Muscle tone and strength are WNL. No limitations in general ROM. No crepitus or effusions noted. Foot type and digits show no abnormalities. Bony prominences are unremarkable. Skin: No Porokeratosis. No infection or ulcers.    Diagnosis:  Tinea unguium, Pain in right toe, pain in left toes  Treatment & Plan Procedures and Treatment: Consent by patient was obtained for treatment procedures. The patient understood the discussion of treatment and procedures well. All questions were answered thoroughly reviewed. Debridement of mycotic and hypertrophic toenails, 1 through 5 bilateral and clearing of subungual debris. No ulceration, no infection noted.  Return Visit-Office Procedure: Patient instructed to return to the office for a follow up visit 3 months for continued evaluation and treatment.   Helane Gunther DPM

## 2018-09-18 ENCOUNTER — Other Ambulatory Visit: Payer: Self-pay

## 2018-09-18 ENCOUNTER — Ambulatory Visit (INDEPENDENT_AMBULATORY_CARE_PROVIDER_SITE_OTHER): Payer: Medicare Other | Admitting: Podiatry

## 2018-09-18 ENCOUNTER — Encounter: Payer: Self-pay | Admitting: Podiatry

## 2018-09-18 VITALS — Temp 97.8°F

## 2018-09-18 DIAGNOSIS — E119 Type 2 diabetes mellitus without complications: Secondary | ICD-10-CM

## 2018-09-18 DIAGNOSIS — M79676 Pain in unspecified toe(s): Secondary | ICD-10-CM | POA: Diagnosis not present

## 2018-09-18 DIAGNOSIS — B351 Tinea unguium: Secondary | ICD-10-CM

## 2018-09-18 NOTE — Progress Notes (Signed)
Patient ID: Felicia Payne, female   DOB: Aug 31, 1930, 85 y.o.   MRN: [id number] Complaint:  Visit Type: Patient returns to my office for continued preventative foot care services. Complaint: Patient states" my nails have grown long and thick and become painful to walk and wear shoes" Patient has been diagnosed with DM with no foot complications.She presents for preventative foot care services. No changes to ROS  Podiatric Exam: Vascular: dorsalis pedis and posterior tibial pulses are palpable bilateral. Capillary return is immediate. Temperature gradient is WNL. Skin turgor WNL  Sensorium: Normal Semmes Weinstein monofilament test. Normal tactile sensation bilaterally. Nail Exam: Pt has thick disfigured discolored nails with subungual debris noted bilateral entire nail hallux through fifth toenails Ulcer Exam: There is no evidence of ulcer or pre-ulcerative changes or infection. Orthopedic Exam: Muscle tone and strength are WNL. No limitations in general ROM. No crepitus or effusions noted. Foot type and digits show no abnormalities. Bony prominences are unremarkable. Skin: No Porokeratosis. No infection or ulcers.    Diagnosis:  Tinea unguium, Pain in right toe, pain in left toes  Treatment & Plan Procedures and Treatment: Consent by patient was obtained for treatment procedures. The patient understood the discussion of treatment and procedures well. All questions were answered thoroughly reviewed. Debridement of mycotic and hypertrophic toenails, 1 through 5 bilateral and clearing of subungual debris. No ulceration, no infection noted.  Return Visit-Office Procedure: Patient instructed to return to the office for a follow up visit 3 months for continued evaluation and treatment.   Helane Gunther DPM

## 2018-12-25 ENCOUNTER — Encounter: Payer: Self-pay | Admitting: Podiatry

## 2018-12-25 ENCOUNTER — Ambulatory Visit (INDEPENDENT_AMBULATORY_CARE_PROVIDER_SITE_OTHER): Payer: Self-pay | Admitting: Podiatry

## 2018-12-25 ENCOUNTER — Other Ambulatory Visit: Payer: Self-pay

## 2018-12-25 DIAGNOSIS — E119 Type 2 diabetes mellitus without complications: Secondary | ICD-10-CM

## 2018-12-25 DIAGNOSIS — B351 Tinea unguium: Secondary | ICD-10-CM

## 2018-12-25 DIAGNOSIS — M79676 Pain in unspecified toe(s): Secondary | ICD-10-CM

## 2018-12-25 NOTE — Progress Notes (Signed)
Patient ID: Felicia Payne, female   DOB: 01/03/1931, 85 y.o.   MRN: [id number] Complaint:  Visit Type: Patient returns to my office for continued preventative foot care services. Complaint: Patient states" my nails have grown long and thick and become painful to walk and wear shoes" Patient has been diagnosed with DM with no foot complications.She presents for preventative foot care services. No changes to ROS  Podiatric Exam: Vascular: dorsalis pedis and posterior tibial pulses are palpable bilateral. Capillary return is immediate. Temperature gradient is WNL. Skin turgor WNL  Sensorium: Normal Semmes Weinstein monofilament test. Normal tactile sensation bilaterally. Nail Exam: Pt has thick disfigured discolored nails with subungual debris noted bilateral entire nail hallux through fifth toenails Ulcer Exam: There is no evidence of ulcer or pre-ulcerative changes or infection. Orthopedic Exam: Muscle tone and strength are WNL. No limitations in general ROM. No crepitus or effusions noted. Foot type and digits show no abnormalities. Bony prominences are unremarkable. Skin: No Porokeratosis. No infection or ulcers.    Diagnosis:  Tinea unguium, Pain in right toe, pain in left toes  Treatment & Plan Procedures and Treatment: Consent by patient was obtained for treatment procedures. The patient understood the discussion of treatment and procedures well. All questions were answered thoroughly reviewed. Debridement of mycotic and hypertrophic toenails, 1 through 5 bilateral and clearing of subungual debris. No ulceration, no infection noted. Patient needs to be accompanied by medical caregiver each visit. Return Visit-Office Procedure: Patient instructed to return to the office for a follow up visit 3 months for continued evaluation and treatment.   Gardiner Barefoot DPM

## 2019-03-26 ENCOUNTER — Other Ambulatory Visit: Payer: Self-pay

## 2019-03-26 ENCOUNTER — Encounter: Payer: Self-pay | Admitting: Podiatry

## 2019-03-26 ENCOUNTER — Ambulatory Visit (INDEPENDENT_AMBULATORY_CARE_PROVIDER_SITE_OTHER): Payer: Medicare Other | Admitting: Podiatry

## 2019-03-26 DIAGNOSIS — E119 Type 2 diabetes mellitus without complications: Secondary | ICD-10-CM

## 2019-03-26 DIAGNOSIS — B351 Tinea unguium: Secondary | ICD-10-CM | POA: Diagnosis not present

## 2019-03-26 DIAGNOSIS — M79676 Pain in unspecified toe(s): Secondary | ICD-10-CM | POA: Diagnosis not present

## 2019-03-26 NOTE — Progress Notes (Signed)
Patient ID: Felicia Payne, female   DOB: [birth year], 84 y.o.   MRN: [id number] Complaint:  Visit Type: Patient returns to my office for continued preventative foot care services. Complaint: Patient states" my nails have grown long and thick and become painful to walk and wear shoes" Patient has been diagnosed with DM with no foot complications.She presents for preventative foot care services. No changes to ROS  Podiatric Exam: Vascular: dorsalis pedis and posterior tibial pulses are palpable bilateral. Capillary return is immediate. Temperature gradient is WNL. Skin turgor WNL  Sensorium: Normal Semmes Weinstein monofilament test. Normal tactile sensation bilaterally. Nail Exam: Pt has thick disfigured discolored nails with subungual debris noted bilateral entire nail hallux through fifth toenails Ulcer Exam: There is no evidence of ulcer or pre-ulcerative changes or infection. Orthopedic Exam: Muscle tone and strength are WNL. No limitations in general ROM. No crepitus or effusions noted. Foot type and digits show no abnormalities. Bony prominences are unremarkable. Skin: No Porokeratosis. No infection or ulcers.    Diagnosis:  Tinea unguium, Pain in right toe, pain in left toes  Treatment & Plan Procedures and Treatment: Consent by patient was obtained for treatment procedures. The patient understood the discussion of treatment and procedures well. All questions were answered thoroughly reviewed. Debridement of mycotic and hypertrophic toenails, 1 through 5 bilateral and clearing of subungual debris. No ulceration, no infection noted. Patient needs to be accompanied by medical caregiver each visit. Return Visit-Office Procedure: Patient instructed to return to the office for a follow up visit 3 months for continued evaluation and treatment.   Gardiner Barefoot DPM

## 2019-06-25 ENCOUNTER — Ambulatory Visit (INDEPENDENT_AMBULATORY_CARE_PROVIDER_SITE_OTHER): Payer: Medicare Other | Admitting: Podiatry

## 2019-06-25 ENCOUNTER — Encounter: Payer: Self-pay | Admitting: Podiatry

## 2019-06-25 ENCOUNTER — Other Ambulatory Visit: Payer: Self-pay

## 2019-06-25 VITALS — Temp 97.3°F

## 2019-06-25 DIAGNOSIS — B351 Tinea unguium: Secondary | ICD-10-CM | POA: Diagnosis not present

## 2019-06-25 DIAGNOSIS — E119 Type 2 diabetes mellitus without complications: Secondary | ICD-10-CM

## 2019-06-25 DIAGNOSIS — M79676 Pain in unspecified toe(s): Secondary | ICD-10-CM

## 2019-06-25 NOTE — Progress Notes (Signed)
Patient ID: Felicia Payne, female   DOB: 09-16-1930, 86 y.o.   MRN: [id number] Complaint:  Visit Type: Patient returns to my office for continued preventative foot care services. Complaint: Patient states" my nails have grown long and thick and become painful to walk and wear shoes" Patient has been diagnosed with DM with no foot complications.She presents for preventative foot care services. No changes to ROS.  Patient is brought to the office by nurse from home and she is in a wheelchair.  Podiatric Exam: Vascular: dorsalis pedis and posterior tibial pulses are palpable bilateral. Capillary return is immediate. Temperature gradient is WNL. Skin turgor WNL  Sensorium: Normal Semmes Weinstein monofilament test. Normal tactile sensation bilaterally. Nail Exam: Pt has thick disfigured discolored nails with subungual debris noted bilateral entire nail hallux through fifth toenails Ulcer Exam: There is no evidence of ulcer or pre-ulcerative changes or infection. Orthopedic Exam: Muscle tone and strength are WNL. No limitations in general ROM. No crepitus or effusions noted. Foot type and digits show no abnormalities. Bony prominences are unremarkable. Skin: No Porokeratosis. No infection or ulcers.    Diagnosis:  Tinea unguium, Pain in right toe, pain in left toes  Treatment & Plan Procedures and Treatment: Consent by patient was obtained for treatment procedures. The patient understood the discussion of treatment and procedures well. All questions were answered thoroughly reviewed. Debridement of mycotic and hypertrophic toenails, 1 through 5 bilateral and clearing of subungual debris. No ulceration, no infection noted. Patient needs to be accompanied by medical caregiver each visit. Return Visit-Office Procedure: Patient instructed to return to the office for a follow up visit 3 months for continued evaluation and treatment.   Helane Gunther DPM

## 2020-09-22 DEATH — deceased
